# Patient Record
Sex: Male | Born: 1995
Health system: Southern US, Community
[De-identification: ages and names within clinical notes are randomized; demographics above are authoritative.]

## PROBLEM LIST (undated history)

## (undated) DIAGNOSIS — B20 Human immunodeficiency virus [HIV] disease: Secondary | ICD-10-CM

## (undated) HISTORY — DX: Human immunodeficiency virus (HIV) disease: B20

---

## 2016-01-02 ENCOUNTER — Emergency Department (HOSPITAL_COMMUNITY)
Admission: EM | Admit: 2016-01-02 | Discharge: 2016-01-02 | Disposition: A | Discharge status: Home / Self Care | Payer: Medicaid Other | Attending: Emergency Medicine | Admitting: Emergency Medicine

## 2016-01-02 ENCOUNTER — Encounter (HOSPITAL_COMMUNITY): Payer: Self-pay | Admitting: Emergency Medicine

## 2016-01-02 DIAGNOSIS — F17210 Nicotine dependence, cigarettes, uncomplicated: Secondary | ICD-10-CM | POA: Diagnosis not present

## 2016-01-02 DIAGNOSIS — J029 Acute pharyngitis, unspecified: Secondary | ICD-10-CM | POA: Diagnosis not present

## 2016-01-02 LAB — RAPID STREP SCREEN (MED CTR MEBANE ONLY): STREPTOCOCCUS, GROUP A SCREEN (DIRECT): NEGATIVE

## 2016-01-02 MED ORDER — HYDROCODONE-ACETAMINOPHEN 7.5-325 MG/15ML PO SOLN: 15.0000 mL | Freq: Three times a day (TID) | ORAL | Status: DC | PRN

## 2016-01-02 MED ORDER — ACETAMINOPHEN 500 MG PO TABS
1000.0000 mg | ORAL_TABLET | Freq: Once | ORAL | Status: AC
  Administered 2016-01-02: 1000 mg via ORAL
  Filled 2016-01-02: qty 2

## 2016-01-02 NOTE — Discharge Instructions (Signed)
Take your medication as prescribed as needed for sore throat. I also recommend taking 600 mg ibuprofen 4 times daily as needed for pain relief. I recommend drinking at least six 8 ounce glasses of water daily to remain hydrated at home. Please follow up with a primary care provider from the Resource Guide provided below in 4-5 days if your symptoms have not improved. Please return to the Emergency Department if symptoms worsen or new onset of fever, facial/neck swelling, difficulty swallowing, unable to swallow due to pain/swelling resulting in drooling, unable to open jaw, difficulty breathing, change in voice. Pharyngitis Pharyngitis is redness, pain, and swelling (inflammation) of your pharynx.  CAUSES  Pharyngitis is usually caused by infection. Most of the time, these infections are from viruses (viral) and are part of a cold. However, sometimes pharyngitis is caused by bacteria (bacterial). Pharyngitis can also be caused by allergies. Viral pharyngitis may be spread from person to person by coughing, sneezing, and personal items or utensils (cups, forks, spoons, toothbrushes). Bacterial pharyngitis may be spread from person to person by more intimate contact, such as kissing.  SIGNS AND SYMPTOMS  Symptoms of pharyngitis include:   Sore throat.   Tiredness (fatigue).   Low-grade fever.   Headache.  Joint pain and muscle aches.  Skin rashes.  Swollen lymph nodes.  Plaque-like film on throat or tonsils (often seen with bacterial pharyngitis). DIAGNOSIS  Your health care provider will ask you questions about your illness and your symptoms. Your medical history, along with a physical exam, is often all that is needed to diagnose pharyngitis. Sometimes, a rapid strep test is done. Other lab tests may also be done, depending on the suspected cause.  TREATMENT  Viral pharyngitis will usually get better in 3-4 days without the use of medicine. Bacterial pharyngitis is treated with  medicines that kill germs (antibiotics).  HOME CARE INSTRUCTIONS   Drink enough water and fluids to keep your urine clear or pale yellow.   Only take over-the-counter or prescription medicines as directed by your health care provider:   If you are prescribed antibiotics, make sure you finish them even if you start to feel better.   Do not take aspirin.   Get lots of rest.   Gargle with 8 oz of salt water ( tsp of salt per 1 qt of water) as often as every 1-2 hours to soothe your throat.   Throat lozenges (if you are not at risk for choking) or sprays may be used to soothe your throat. SEEK MEDICAL CARE IF:   You have large, tender lumps in your neck.  You have a rash.  You cough up green, yellow-brown, or bloody spit. SEEK IMMEDIATE MEDICAL CARE IF:   Your neck becomes stiff.  You drool or are unable to swallow liquids.  You vomit or are unable to keep medicines or liquids down.  You have severe pain that does not go away with the use of recommended medicines.  You have trouble breathing (not caused by a stuffy nose). MAKE SURE YOU:   Understand these instructions.  Will watch your condition.  Will get help right away if you are not doing well or get worse.   This information is not intended to replace advice given to you by your health care provider. Make sure you discuss any questions you have with your health care provider.   Document Released: 08/26/2005 Document Revised: 06/16/2013 Document Reviewed: 05/03/2013 Elsevier Interactive Patient Education [employer]2016 Elsevier Inc.

## 2016-01-02 NOTE — ED Provider Notes (Signed)
CSN: [id number]     Arrival date & time 01/02/16  1722 History  By signing my name below, I, Iona Beard, attest that this documentation has been prepared under the direction and in the presence of Melburn Hake, PA-C  Electronically Signed: Iona Beard, ED Scribe 01/02/2016 at 6:20 PM.   Chief Complaint  Patient presents with  . Sore Throat    The history is provided by the patient. No language interpreter was used.   HPI Comments: Aaron Phelps is a 20 y.o. male who presents to the Emergency Department complaining of gradual onset, constant, sore throat, ongoing for two days. Pt reports associated productive cough with clear mucous ongoing for one week. No other associated symptoms noted. Throat pain is worsened with swallowing. Pt has not taken any medication for the pain PTA. No other worsening or alleviating factors noted. Pt denies fever, ear pain, facial swelling, neck swelling, drooling, voice changes, nausea, vomiting, wheezing, shortness of breath, or any other pertinent symptoms.   History reviewed. No pertinent past medical history. History reviewed. No pertinent past surgical history. History reviewed. No pertinent family history. Social History  Substance Use Topics  . Smoking status: Current Every Day Smoker    Types: Cigarettes  . Smokeless tobacco: None  . Alcohol Use: Yes    Review of Systems  Constitutional: Negative for fever.  HENT: Positive for sore throat. Negative for drooling, ear pain, facial swelling and voice change.   Respiratory: Positive for cough. Negative for shortness of breath and wheezing.   Gastrointestinal: Negative for nausea and vomiting.    Allergies  Review of patient's allergies indicates no known allergies.  Home Medications   Prior to Admission medications   Medication Sig Start Date End Date Taking? Authorizing Provider  HYDROcodone-acetaminophen (HYCET) 7.5-325 mg/15 ml solution Take 15 mLs by mouth every 8 (eight) hours  as needed for moderate pain. 01/02/16   Satira Sark Nadeau, PA-C   BP 121/80 mmHg  Pulse 73  Temp(Src) 99.9 F (37.7 C) (Oral)  Resp 12  Ht  (1.778 m)  Wt 125 lb (56.7 kg)  BMI 17.94 kg/m2  SpO2 100% Physical Exam  Constitutional: He is oriented to person, place, and time. He appears well-developed and well-nourished.  HENT:  Head: Normocephalic and atraumatic.  Right Ear: Tympanic membrane normal.  Left Ear: Tympanic membrane normal.  Nose: Nose normal. Right sinus exhibits no maxillary sinus tenderness and no frontal sinus tenderness. Left sinus exhibits no maxillary sinus tenderness and no frontal sinus tenderness.  Mouth/Throat: Uvula is midline and mucous membranes are normal. No trismus in the jaw. Normal dentition. No dental abscesses, uvula swelling or dental caries. Posterior oropharyngeal edema and posterior oropharyngeal erythema present. No oropharyngeal exudate or tonsillar abscesses.  No facial or neck swelling. No muffled voice.  Eyes: Conjunctivae and EOM are normal. Pupils are equal, round, and reactive to light. Right eye exhibits no discharge. Left eye exhibits no discharge. No scleral icterus.  Neck: Normal range of motion. Neck supple.  Cardiovascular: Normal rate, regular rhythm, normal heart sounds and intact distal pulses.   Pulmonary/Chest: Effort normal and breath sounds normal. No stridor. No respiratory distress. He has no wheezes. He has no rales. He exhibits no tenderness.  Abdominal: Soft. He exhibits no distension.  Lymphadenopathy:    He has cervical adenopathy (bilateral submandibular ).  Neurological: He is alert and oriented to person, place, and time.  Skin: Skin is warm and dry.  Nursing note and vitals reviewed.  ED Course  Procedures (including critical care time) DIAGNOSTIC STUDIES: Oxygen Saturation is 100% on RA, normal by my interpretation.    COORDINATION OF CARE: 5:43 PM-Discussed treatment plan which includes rapid strep  screen and tylenol with pt at bedside and pt agreed to plan.   Labs Review Labs Reviewed  [facility])  CULTURE, GROUP A STREP [facility])    Imaging Review No results found. I have personally reviewed and evaluated these lab results as part of my medical decision-making.   EKG Interpretation None      MDM   Final diagnoses:  Pharyngitis   Pt afebrile without tonsillar exudate, negative strep. Presents with mild cervical lymphadenopathy, & dysphagia; diagnosis of viral pharyngitis. No abx indicated. DC w symptomatic tx for pain  Pt does not appear dehydrated, but did discuss importance of water rehydration. Presentation non concerning for PTA or infxn spread to soft tissue. No trismus or uvula deviation. Specific return precautions discussed. Pt able to drink water in ED without difficulty with intact air way. Recommended PCP follow up.   I personally performed the services described in this documentation, which was scribed in my presence. The recorded information has been reviewed and is accurate.    Satira Sarkicole Elizabeth HinesNadeau, [place] JerseyPA-C 01/02/16 1821  Pricilla LovelessScott Goldston, MD 01/04/16 [phone]

## 2016-01-02 NOTE — ED Notes (Signed)
Pt states he has had a sore throat for several days. Pt states it is painful to swallow. Pt denies any other symptoms

## 2016-01-04 LAB — CULTURE, GROUP A STREP (THRC)

## 2016-01-06 ENCOUNTER — Encounter (HOSPITAL_COMMUNITY): Payer: Self-pay

## 2016-01-06 ENCOUNTER — Emergency Department (HOSPITAL_COMMUNITY)
Admission: EM | Admit: 2016-01-06 | Discharge: 2016-01-06 | Disposition: A | Discharge status: Home / Self Care | Payer: Medicaid Other | Attending: Emergency Medicine | Admitting: Emergency Medicine

## 2016-01-06 DIAGNOSIS — F17210 Nicotine dependence, cigarettes, uncomplicated: Secondary | ICD-10-CM | POA: Diagnosis not present

## 2016-01-06 DIAGNOSIS — J020 Streptococcal pharyngitis: Secondary | ICD-10-CM | POA: Diagnosis not present

## 2016-01-06 DIAGNOSIS — J029 Acute pharyngitis, unspecified: Secondary | ICD-10-CM | POA: Diagnosis present

## 2016-01-06 MED ORDER — PENICILLIN G BENZATHINE 1200000 UNIT/2ML IM SUSP
1.2000 10*6.[IU] | Freq: Once | INTRAMUSCULAR | Status: AC
  Administered 2016-01-06: 1.2 10*6.[IU] via INTRAMUSCULAR
  Filled 2016-01-06: qty 2

## 2016-01-06 MED ORDER — DEXAMETHASONE SODIUM PHOSPHATE 10 MG/ML IJ SOLN
10.0000 mg | Freq: Once | INTRAMUSCULAR | Status: AC
  Administered 2016-01-06: 10 mg via INTRAMUSCULAR
  Filled 2016-01-06: qty 1

## 2016-01-06 NOTE — ED Notes (Addendum)
Patient here with ongoing swollen tonsils since visit on the 25th. Patient thinks something in the tonsils and request recheck. Negative strep on previous visit, left tonsil swollen on assessment

## 2016-01-06 NOTE — Discharge Instructions (Signed)
Strep Throat °Strep throat is a bacterial infection of the throat. Your health care provider may call the infection tonsillitis or pharyngitis, depending on whether there is swelling in the tonsils or at the back of the throat. Strep throat is most common during the cold months of the year in children who are 5-21 years of age, but it can happen during any season in people of any age. This infection is spread from person to person (contagious) through coughing, sneezing, or close contact. °CAUSES °Strep throat is caused by the bacteria called Streptococcus pyogenes. °RISK FACTORS °This condition is more likely to develop in: °· People who spend time in crowded places where the infection can spread easily. °· People who have close contact with someone who has strep throat. °SYMPTOMS °Symptoms of this condition include: °· Fever or chills.   °· Redness, swelling, or pain in the tonsils or throat. °· Pain or difficulty when swallowing. °· White or yellow spots on the tonsils or throat. °· Swollen, tender glands in the neck or under the jaw. °· Red rash all over the body (rare). °DIAGNOSIS °This condition is diagnosed by performing a rapid strep test or by taking a swab of your throat (throat culture test). Results from a rapid strep test are usually ready in a few minutes, but throat culture test results are available after one or two days. °TREATMENT °This condition is treated with antibiotic medicine. °HOME CARE INSTRUCTIONS °Medicines °· Take over-the-counter and prescription medicines only as told by your health care provider. °· Take your antibiotic as told by your health care provider. Do not stop taking the antibiotic even if you start to feel better. °· Have family members who also have a sore throat or fever tested for strep throat. They may need antibiotics if they have the strep infection. °Eating and Drinking °· Do not share food, drinking cups, or personal items that could cause the infection to spread to  other people. °· If swallowing is difficult, try eating soft foods until your sore throat feels better. °· Drink enough fluid to keep your urine clear or pale yellow. °General Instructions °· Gargle with a salt-water mixture 3-4 times per day or as needed. To make a salt-water mixture, completely dissolve ½-1 tsp of salt in 1 cup of warm water. °· Make sure that all household members wash their hands well. °· Get plenty of rest. °· Stay home from school or work until you have been taking antibiotics for 24 hours. °· Keep all follow-up visits as told by your health care provider. This is important. °SEEK MEDICAL CARE IF: °· The glands in your neck continue to get bigger. °· You develop a rash, cough, or earache. °· You cough up a thick liquid that is green, yellow-brown, or bloody. °· You have pain or discomfort that does not get better with medicine. °· Your problems seem to be getting worse rather than better. °· You have a fever. °SEEK IMMEDIATE MEDICAL CARE IF: °· You have new symptoms, such as vomiting, severe headache, stiff or painful neck, chest pain, or shortness of breath. °· You have severe throat pain, drooling, or changes in your voice. °· You have swelling of the neck, or the skin on the neck becomes red and tender. °· You have signs of dehydration, such as fatigue, dry mouth, and decreased urination. °· You become increasingly sleepy, or you cannot wake up completely. °· Your joints become red or painful. °  °This information is not intended to replace   advice given to you by your health care provider. Make sure you discuss any questions you have with your health care provider.    Continue taking home Tylenol and pain medication as needed. Follow up with ENT provider if her symptoms are improved. Please return to the emergency department if you experience severe worsening of your symptoms, rash, increased fever, difficulty breathing or swallowing.

## 2016-01-06 NOTE — ED Provider Notes (Signed)
CSN: [id number]     Arrival date & time 01/06/16  1306 History   First MD Initiated Contact with Patient 01/06/16 1423     Chief Complaint  Patient presents with  . swollen tonsils      (Consider location/radiation/quality/duration/timing/severity/associated sxs/prior Treatment) HPI   Coltyn Hanning is a 20 year old male with no significant past medical history who presents the ED today complaining of sore throat. Patient states that he was in the ED 4 days ago with milder symptoms and had a negative strep test. He has been taking hydrocodone which relieved his pain but he feels that the infection is getting worse. He looked in the back of his throat yesterday and noticed that his left tonsil was really swollen and it looks like there is white stuff on it. Patient is concerned that it is infected. He states he is able to swallow but it is painful. Patient reports subjective fevers which has been taking Tylenol for with minimal relief. He denies cough, rhinorrhea, otalgia, drooling, difficulty breathing.   History reviewed. No pertinent past medical history. History reviewed. No pertinent past surgical history. No family history on file. Social History  Substance Use Topics  . Smoking status: Current Every Day Smoker    Types: Cigarettes  . Smokeless tobacco: None  . Alcohol Use: Yes    Review of Systems  All other systems reviewed and are negative.     Allergies  Review of patient's allergies indicates no known allergies.  Home Medications   Prior to Admission medications   Medication Sig Start Date End Date Taking? Authorizing Provider  HYDROcodone-acetaminophen (HYCET) 7.5-325 mg/15 ml solution Take 15 mLs by mouth every 8 (eight) hours as needed for moderate pain. 01/02/16   Satira Sark Nadeau, PA-C   BP 121/86 mmHg  Pulse 117  Temp(Src) 98.1 F (36.7 C) (Oral)  Resp 18  Ht  (1.778 m)  Wt 56.7 kg  BMI 17.94 kg/m2  SpO2 98% Physical Exam  Constitutional:  He is oriented to person, place, and time. He appears well-developed and well-nourished. No distress.  HENT:  Head: Normocephalic and atraumatic.  Mouth/Throat: Uvula is midline. No trismus in the jaw. No uvula swelling. Oropharyngeal exudate, posterior oropharyngeal edema and posterior oropharyngeal erythema present.  Bilateral tonsillar exudate present. Left greater than right. No PTA. Uvula midline.  Eyes: Conjunctivae are normal. Right eye exhibits no discharge. Left eye exhibits no discharge. No scleral icterus.  Cardiovascular: Normal rate.   Pulmonary/Chest: Effort normal.  Neurological: He is alert and oriented to person, place, and time. Coordination normal.  Skin: Skin is warm and dry. No rash noted. He is not diaphoretic. No erythema. No pallor.  Psychiatric: He has a normal mood and affect. His behavior is normal.  Nursing note and vitals reviewed.   ED Course  Procedures (including critical care time) Labs Review Labs Reviewed - No data to display  Imaging Review No results found. I have personally reviewed and evaluated these images and lab results as part of my medical decision-making.   EKG Interpretation None      MDM   Final diagnoses:  Strep pharyngitis    Pt  With significant tonsillar exudate, cervical lymphadenopathy, & dysphagia; diagnosis of strep. Treated in the Ed with steroids, NSAIDs, Pain medication and PCN IM.  Pt appears mildly dehydrated, discussed importance of water rehydration. Presentation non concerning for PTA or infxn spread to soft tissue. No trismus or uvula deviation. Specific return precautions discussed. Pt able to drink  water in ED without difficulty with intact air way. Recommended PCP follow up. ENT referral given per pt request.     Dub MikesSamantha Tripp Jaamal Farooqui, PA-C 01/06/16 1531  Linwood DibblesJon Knapp, MD 01/06/16 2044

## 2016-02-03 ENCOUNTER — Emergency Department (HOSPITAL_COMMUNITY)
Admission: EM | Admit: 2016-02-03 | Discharge: 2016-02-03 | Disposition: A | Discharge status: Home / Self Care | Payer: Medicaid Other | Attending: Emergency Medicine | Admitting: Emergency Medicine

## 2016-02-03 ENCOUNTER — Encounter (HOSPITAL_COMMUNITY): Payer: Self-pay

## 2016-02-03 DIAGNOSIS — L219 Seborrheic dermatitis, unspecified: Secondary | ICD-10-CM

## 2016-02-03 DIAGNOSIS — F17210 Nicotine dependence, cigarettes, uncomplicated: Secondary | ICD-10-CM | POA: Insufficient documentation

## 2016-02-03 DIAGNOSIS — K6289 Other specified diseases of anus and rectum: Secondary | ICD-10-CM | POA: Diagnosis present

## 2016-02-03 DIAGNOSIS — Z79899 Other long term (current) drug therapy: Secondary | ICD-10-CM | POA: Insufficient documentation

## 2016-02-03 DIAGNOSIS — K602 Anal fissure, unspecified: Secondary | ICD-10-CM | POA: Diagnosis not present

## 2016-02-03 MED ORDER — KETOCONAZOLE 2 % EX CREA: 1.0000 "application " | TOPICAL_CREAM | Freq: Every day | CUTANEOUS | Status: DC

## 2016-02-03 MED ORDER — NITROGLYCERIN 2 % TD OINT: 0.5000 [in_us] | TOPICAL_OINTMENT | Freq: Three times a day (TID) | TRANSDERMAL | Status: DC

## 2016-02-03 MED ORDER — LIDOCAINE 2 % EX GEL: 0.5000 [in_us] | CUTANEOUS | Status: DC | PRN

## 2016-02-03 MED ORDER — DOCUSATE SODIUM 100 MG PO CAPS: 100.0000 mg | ORAL_CAPSULE | Freq: Two times a day (BID) | ORAL | Status: DC

## 2016-02-03 NOTE — Discharge Instructions (Signed)
Anal Fissure, Adult An anal fissure is a small tear or crack in the skin around the anus. Bleeding from a fissure usually stops on its own within a few minutes. However, bleeding will often occur again with each bowel movement until the crack heals. CAUSES This condition may be caused by:  Passing large, hard stool (feces).  Frequent diarrhea.  Constipation.  Inflammatory bowel disease (Crohn disease or ulcerative colitis).  Infections.  Anal sex. SYMPTOMS Symptoms of this condition include:  Bleeding from the rectum.  Small amounts of blood seen on your stool, on toilet paper, or in the toilet after a bowel movement.  Painful bowel movements.  Itching or irritation around the anus. DIAGNOSIS A health care provider may diagnose this condition by closely examining the anal area. An anal fissure can usually be seen with careful inspection. In some cases, a rectal exam may be performed, or a short tube (anoscope) may be used to examine the anal canal. TREATMENT Treatment for this condition may include:  Taking steps to avoid constipation. This may include making changes to your diet, such as increasing your intake of fiber or fluid.  Taking fiber supplements. These supplements can soften your stool to help make bowel movements easier. Your health care provider may also prescribe a stool softener if your stool is often hard.  Taking sitz baths. This may help to heal the tear.  Using medicated creams or ointments. These may be prescribed to lessen discomfort. HOME CARE INSTRUCTIONS Eating and Drinking  Avoid foods that may be constipating, such as bananas and dairy products.  Drink enough fluid to keep your urine clear or pale yellow.  Maintain a diet that is high in fruits, whole grains, and vegetables. General Instructions  Keep the anal area as clean and dry as possible.  Take sitz baths as told by your health care provider. Do not use soap in the sitz baths.  Take  over-the-counter and prescription medicines only as told by your health care provider.  Use creams or ointments only as told by your health care provider.  Keep all follow-up visits as told by your health care provider. This is important. SEEK MEDICAL CARE IF:  You have more bleeding.  You have a fever.  You have diarrhea that is mixed with blood.  You continue to have pain.  Your problem is getting worse rather than better.   This information is not intended to replace advice given to you by your health care provider. Make sure you discuss any questions you have with your health care provider.   Document Released: 08/26/2005 Document Revised: 05/17/2015 Document Reviewed: 11/21/2014 Elsevier Interactive Patient Education 2016 Elsevier Inc.  Seborrheic Dermatitis Seborrheic dermatitis involves pink or red skin with greasy, flaky scales. It usually occurs on the scalp, and it is often called dandruff. This condition may also affect the eyebrows, nose, ears, chest, and the bearded area of men's faces. It often occurs where skin has more oil (sebaceous) glands. It may come and go for no known reason, and it is often long-lasting (chronic). CAUSES The cause is not known. RISK FACTORS This condition is more like to develop in:  People who are stressed or tired.  People who have skin conditions, such as acne.  People who have certain conditions, such as:  HIV (human immunodeficiency virus).  AIDS (acquired immunodeficiency syndrome).  Parkinson disease.  An eating disorder.  Stroke.  Depression.  Epilepsy.  Alcoholism.  People who live in places that have extreme  weather.  People who have a family history of seborrheic dermatitis.  People who use skin creams that are made with alcohol.  People who are 51-82 years old.  People who take certain medicines. SYMPTOMS Symptoms of this condition include:  Thick scales on the scalp.  Redness on the face or in the  armpits.  Skin that is flaky. The flakes may be white or yellow.  Skin that seems oily or dry but is not helped with moisturizers.  Itching or burning in the affected areas. DIAGNOSIS This condition is diagnosed with a medical history and physical exam. A sample of your skin may be tested (skin biopsy). You may need to see a skin specialist (dermatologist). TREATMENT There is no cure for this condition, but treatment can help to manage the symptoms. Treatment may include:  Cortisone (steroid) ointments, creams, and lotions.  Over-the-counter or prescription shampoos. HOME CARE INSTRUCTIONS  Apply over-the-counter and prescription medicines only as told by your health care provider.  Keep all follow-up visits as told by your health care provider. This is important.  Try to reduce your stress, such as with yoga or mediation. If you need help to reduce stress, ask your health care provider.  Shower or bathe as told by your health care provider.  Use any medicated shampoos as told by your health care provider. SEEK MEDICAL CARE IF:  Your symptoms do not improve with treatment.  Your symptoms get worse.  You have new symptoms.   This information is not intended to replace advice given to you by your health care provider. Make sure you discuss any questions you have with your health care provider.  Take sitz baths daily. This will help loosen the anal mucosa and allow it to heal. Applied nitroglycerin ointment 3 times daily to the rectum. Apply lidocaine jelly as needed for pain and numbing. Take stool softeners daily to allow for easy or bowel movements. Apply ketoconazole cream to your face twice daily. Also recommend using head and shoulders shampoo for dandruff. Return to the ED if you experience severe worsening of your symptoms, blood in your bowel movements.

## 2016-02-03 NOTE — ED Notes (Signed)
Patient here complaining of rectal pain with sitting from hemorrhoid and dry skin to face.

## 2016-02-03 NOTE — ED Provider Notes (Signed)
CSN: [id number]     Arrival date & time 02/03/16  1242 History   First MD Initiated Contact with Patient 02/03/16 1339     Chief Complaint  Patient presents with  . Hemorrhoids     (Consider location/radiation/quality/duration/timing/severity/associated sxs/prior Treatment) HPI   Aaron Phelps is a 20 year old male with no significant past medical history presents the ED today with multiple complaints. Patient states that 3 days ago he developed acute onset severe rectal pain that occurred after anal intercourse. Patient states that he has significant pain with bowel movements and pain with sitting down. Patient has not tried taking anything for his symptoms. Pain is mildly relieved when he lays on his side. He denies any melena or hematochezia. Patient thinks that he may have hemorrhoids but he doesn't know because he has never had them before.  Patient also complaining of rash to his face. Patient states that initially started out as dry skin around his nose but is since spread to his cheeks and forehead. He has been placing Vaseline and cocoa butter on it without relief rashes occasionally itchy.  History reviewed. No pertinent past medical history. History reviewed. No pertinent past surgical history. No family history on file. Social History  Substance Use Topics  . Smoking status: Current Every Day Smoker    Types: Cigarettes  . Smokeless tobacco: None  . Alcohol Use: Yes    Review of Systems  All other systems reviewed and are negative.     Allergies  Review of patient's allergies indicates no known allergies.  Home Medications   Prior to Admission medications   Medication Sig Start Date End Date Taking? Authorizing Provider  docusate sodium (COLACE) 100 MG capsule Take 1 capsule (100 mg total) by mouth every 12 (twelve) hours. 02/03/16   Samantha Tripp Dowless, PA-C  HYDROcodone-acetaminophen (HYCET) 7.5-325 mg/15 ml solution Take 15 mLs by mouth every 8 (eight) hours  as needed for moderate pain. 01/02/16   Barrett Henle, PA-C  ketoconazole (NIZORAL) 2 % cream Apply 1 application topically daily. 02/03/16   Samantha Tripp Dowless, PA-C  Lidocaine 2 % GEL Apply 0.5 inches topically as needed. 02/03/16   Samantha Tripp Dowless, PA-C  nitroGLYCERIN (NITROGLYN) 2 % ointment Apply 0.5 inches topically 3 (three) times daily. 02/03/16   Samantha Tripp Dowless, PA-C   BP 122/83 mmHg  Pulse 71  Temp(Src) 98.6 F (37 C) (Oral)  Resp 16  SpO2 100% Physical Exam  Constitutional: He is oriented to person, place, and time. He appears well-developed and well-nourished. No distress.  HENT:  Head: Normocephalic and atraumatic.  Eyes: Conjunctivae are normal. Right eye exhibits no discharge. Left eye exhibits no discharge. No scleral icterus.  Cardiovascular: Normal rate.   Pulmonary/Chest: Effort normal.  Genitourinary:  Small anal fissure noted on rectum. Pain felt with DRE. No gross blood on rectal exam. No abscess. No external hemorrhoids.   Neurological: He is alert and oriented to person, place, and time. Coordination normal.  Skin: Skin is warm and dry. Rash noted. He is not diaphoretic. No erythema. No pallor.  Dry, flaky rash to bridge of nose, cheeks and forehead.  Psychiatric: He has a normal mood and affect. His behavior is normal.  Nursing note and vitals reviewed.   ED Course  Procedures (including critical care time) Labs Review Labs Reviewed - No data to display  Imaging Review No results found. I have personally reviewed and evaluated these images and lab results as part of my medical decision-making.  EKG Interpretation None      MDM   Final diagnoses:  Anal fissure  Seborrheic dermatitis    Patient presents with small anal fissure noted on rectum likely caused by anal penetration. No gross abscess or deep tear. No blood on rectal exam. We'll treat conservatively with sitz baths, topical nitroglycerin and lidocaine jelly for  pain relief. We'll also give stool softeners to ease bowel movements. Patient also presents with rash on his face consistent with seborrheic dermatitis. We'll treat with ketoconazole cream. Also recommend head and shoulders shampoo. Follow-up with PCP as needed.    Lester KinsmanSamantha Tripp DumasDowless, PA-C 02/03/16 1432  Glynn OctaveStephen Rancour, MD 02/03/16 [phone]

## 2016-02-03 NOTE — ED Notes (Signed)
Samantha PA at bedside 

## 2016-05-25 ENCOUNTER — Emergency Department (HOSPITAL_COMMUNITY)
Admission: EM | Admit: 2016-05-25 | Discharge: 2016-05-25 | Disposition: A | Discharge status: Home / Self Care | Payer: Medicaid Other | Attending: Emergency Medicine | Admitting: Emergency Medicine

## 2016-05-25 ENCOUNTER — Encounter (HOSPITAL_COMMUNITY): Payer: Self-pay | Admitting: Emergency Medicine

## 2016-05-25 DIAGNOSIS — R21 Rash and other nonspecific skin eruption: Secondary | ICD-10-CM | POA: Insufficient documentation

## 2016-05-25 DIAGNOSIS — F17210 Nicotine dependence, cigarettes, uncomplicated: Secondary | ICD-10-CM | POA: Diagnosis not present

## 2016-05-25 DIAGNOSIS — J029 Acute pharyngitis, unspecified: Secondary | ICD-10-CM | POA: Insufficient documentation

## 2016-05-25 LAB — RAPID STREP SCREEN (MED CTR MEBANE ONLY): STREPTOCOCCUS, GROUP A SCREEN (DIRECT): NEGATIVE

## 2016-05-25 MED ORDER — ACETAMINOPHEN 500 MG PO TABS
1000.0000 mg | ORAL_TABLET | Freq: Once | ORAL | Status: AC
  Administered 2016-05-25: 1000 mg via ORAL
  Filled 2016-05-25: qty 2

## 2016-05-25 MED ORDER — IBUPROFEN 800 MG PO TABS: 800.0000 mg | ORAL_TABLET | Freq: Three times a day (TID) | ORAL | 0 refills | Status: DC | PRN

## 2016-05-25 NOTE — ED Provider Notes (Signed)
[facility]-EMERGENCY DEPT Provider Note   CSN: [id number] Arrival date & time: 05/25/16  1424 By signing my name below, I, Bridgette HabermannMaria Tan, attest that this documentation has been prepared under the direction and in the presence of Bashir Marchetti, PA-C. Electronically Signed: Bridgette HabermannMaria Tan, ED Scribe. 05/25/16. 3:02 PM.  History   Chief Complaint Chief Complaint  Patient presents with  . Sore Throat  . Rash   HPI Comments: Aaron Phelps is a 20 y.o. male who presents to the Emergency Department complaining of sore throat onset 2 days ago. Pt also has associated generalized body aches and fever. Pt states he is eating and drinking normally. Pt was seen here a couple months ago for sore throat and was treated with "a shot" and completely resolved. Pt states these symptoms are exactly the same. Pt denies vomiting, diarrhea, or any other associated symptoms.  Does not have regular HIV/STD testing but has been monogamous with one partner for >1 year, doubts STD exposure.   Pt is also complaining of pruritic rash onset one week ago. Pt states he slept over at a friend's house and they think they're bed bugs. Pt put cortisone ointment with mild relief.  States overall it is improving significantly.      The history is provided by the patient. No language interpreter was used.    History reviewed. No pertinent past medical history.  There are no active problems to display for this patient.   History reviewed. No pertinent surgical history.     Home Medications    Prior to Admission medications   Medication Sig Start Date End Date Taking? Authorizing Provider  docusate sodium (COLACE) 100 MG capsule Take 1 capsule (100 mg total) by mouth every 12 (twelve) hours. 02/03/16   Samantha Tripp Dowless, PA-C  HYDROcodone-acetaminophen (HYCET) 7.5-325 mg/15 ml solution Take 15 mLs by mouth every 8 (eight) hours as needed for moderate pain. 01/02/16   Barrett HenleNicole Elizabeth Nadeau, PA-C  ibuprofen (ADVIL,MOTRIN) 800 MG  tablet Take 1 tablet (800 mg total) by mouth every 8 (eight) hours as needed for mild pain or moderate pain. 05/25/16   Trixie DredgeEmily Ivianna Notch, PA-C  ketoconazole (NIZORAL) 2 % cream Apply 1 application topically daily. 02/03/16   Samantha Tripp Dowless, PA-C  Lidocaine 2 % GEL Apply 0.5 inches topically as needed. 02/03/16   Samantha Tripp Dowless, PA-C  nitroGLYCERIN (NITROGLYN) 2 % ointment Apply 0.5 inches topically 3 (three) times daily. 02/03/16   Samantha Tripp Dowless, PA-C    Family History No family history on file.  Social History Social History  Substance Use Topics  . Smoking status: Current Every Day Smoker    Types: Cigarettes  . Smokeless tobacco: Never Used  . Alcohol use Yes     Allergies   Review of patient's allergies indicates no known allergies.   Review of Systems Review of Systems  Constitutional: Positive for fever.  HENT: Positive for sore throat. Negative for trouble swallowing.   Respiratory: Negative for cough, shortness of breath, wheezing and stridor.   Gastrointestinal: Negative for abdominal pain, diarrhea and vomiting.  Musculoskeletal: Positive for back pain and myalgias.  Skin: Positive for rash.  Allergic/Immunologic: Negative for immunocompromised state.  Psychiatric/Behavioral: Negative for self-injury.     Physical Exam Updated Vital Signs BP 117/86 (BP Location: Right Arm)   Pulse 93   Temp 101 F (38.3 C) (Oral)   Resp 16   Ht 5\' 10"  (1.778 m)   Wt 51.3 kg   SpO2 100%  BMI 16.21 kg/m   Physical Exam  Constitutional: He appears well-developed and well-nourished. No distress.  HENT:  Head: Normocephalic and atraumatic.  Mouth/Throat: Uvula is midline. Mucous membranes are not dry. No trismus in the jaw. No uvula swelling. Oropharyngeal exudate and posterior oropharyngeal erythema present. No posterior oropharyngeal edema or tonsillar abscesses.  Eyes: Conjunctivae and EOM are normal. Right eye exhibits no discharge. Left eye exhibits no  discharge.  Neck: Normal range of motion. Neck supple.  Cardiovascular: Normal rate and regular rhythm.   Pulmonary/Chest: Effort normal and breath sounds normal. No stridor. No respiratory distress. He has no wheezes. He has no rales.  Lymphadenopathy:    He has no cervical adenopathy.  Neurological: He is alert.  Skin: He is not diaphoretic.  Nursing note and vitals reviewed.    ED Treatments / Results  DIAGNOSTIC STUDIES: Oxygen Saturation is 100% on RA, normal by my interpretation.    COORDINATION OF CARE: 3:03 PM Discussed treatment plan with pt at bedside and pt agreed to plan.  Labs (all labs ordered are listed, but only abnormal results are displayed) Labs Reviewed  RAPID STREP SCREEN (NOT AT [facility])  CULTURE, GROUP A STREP [facility])    EKG  EKG Interpretation None       Radiology No results found.  Procedures Procedures (including critical care time)  Medications Ordered in ED Medications  acetaminophen (TYLENOL) tablet 1,000 mg (1,000 mg Oral Given 05/25/16 1458)     Initial Impression / Assessment and Plan / ED Course  I have reviewed the triage vital signs and the nursing notes.  Pertinent labs & imaging results that were available during my care of the patient were reviewed by me and considered in my medical decision making (see chart for details).  Clinical Course    Afebrile, nontoxic patient with sore throat.  Strep screen negative.  No airway concerns.  Doubt peritonsillar abscess.   D/C home with NSAIDs, PCP follow up.  Discussed result, findings, treatment, and follow up  with patient.  Pt given return precautions.  Pt verbalizes understanding and agrees with plan.       Final Clinical Impressions(s) / ED Diagnoses   Final diagnoses:  Pharyngitis    New Prescriptions New Prescriptions   IBUPROFEN (ADVIL,MOTRIN) 800 MG TABLET    Take 1 tablet (800 mg total) by mouth every 8 (eight) hours as needed for mild pain or moderate pain.    I  personally performed the services described in this documentation, which was scribed in my presence. The recorded information has been reviewed and is accurate.     Trixie Dredge, PA-C 05/25/16 1652    Pricilla Loveless, MD 05/27/16 [phone]

## 2016-05-25 NOTE — ED Notes (Signed)
Ice chips given to pt.  

## 2016-05-25 NOTE — ED Triage Notes (Signed)
Pt c/o sore throat and bodyaches x 2 days. Pt is febrile and has exudate on tonsils.  Itching rash x 1 week since sleeping over at a friend's house.

## 2016-05-25 NOTE — Discharge Instructions (Signed)
Read the information below.  Use the prescribed medication as directed.  Please discuss all new medications with your pharmacist.  You may return to the Emergency Department at any time for worsening condition or any new symptoms that concern you.   If you develop high fevers, difficulty swallowing or breathing, or you are unable to tolerate fluids by mouth, return to the ER immediately for a recheck.    °

## 2016-05-27 LAB — CULTURE, GROUP A STREP (THRC)

## 2016-06-05 ENCOUNTER — Encounter (HOSPITAL_COMMUNITY): Payer: Self-pay | Admitting: Emergency Medicine

## 2016-06-05 ENCOUNTER — Emergency Department (HOSPITAL_COMMUNITY)
Admission: EM | Admit: 2016-06-05 | Discharge: 2016-06-05 | Disposition: A | Discharge status: Home / Self Care | Payer: Medicaid Other | Attending: Emergency Medicine | Admitting: Emergency Medicine

## 2016-06-05 ENCOUNTER — Emergency Department (HOSPITAL_COMMUNITY): Payer: Medicaid Other

## 2016-06-05 DIAGNOSIS — R079 Chest pain, unspecified: Secondary | ICD-10-CM | POA: Diagnosis present

## 2016-06-05 DIAGNOSIS — F17210 Nicotine dependence, cigarettes, uncomplicated: Secondary | ICD-10-CM | POA: Insufficient documentation

## 2016-06-05 DIAGNOSIS — J029 Acute pharyngitis, unspecified: Secondary | ICD-10-CM | POA: Diagnosis not present

## 2016-06-05 LAB — BASIC METABOLIC PANEL
Anion gap: 8 (ref 5–15)
BUN: 6 mg/dL (ref 6–20)
CALCIUM: 9.1 mg/dL (ref 8.9–10.3)
CO2: 27 mmol/L (ref 22–32)
CREATININE: 0.81 mg/dL (ref 0.61–1.24)
Chloride: 103 mmol/L (ref 101–111)
GFR calc non Af Amer: >60 mL/min (ref >60)
GLUCOSE: 124 mg/dL — AB (ref 65–99)
Potassium: 3.6 mmol/L (ref 3.5–5.1)
Sodium: 138 mmol/L (ref 135–145)

## 2016-06-05 LAB — CBC
HCT: 42.8 % (ref 39.0–52.0)
Hemoglobin: 14.6 g/dL (ref 13.0–17.0)
MCH: 31.3 pg (ref 26.0–34.0)
MCHC: 34.1 g/dL (ref 30.0–36.0)
MCV: 91.6 fL (ref 78.0–100.0)
PLATELETS: 237 10*3/uL (ref 150–400)
RBC: 4.67 MIL/uL (ref 4.22–5.81)
RDW: 12.8 % (ref 11.5–15.5)
WBC: 5.6 10*3/uL (ref 4.0–10.5)

## 2016-06-05 LAB — I-STAT TROPONIN, ED: TROPONIN I, POC: 0 ng/mL (ref 0.00–0.08)

## 2016-06-05 NOTE — ED Triage Notes (Signed)
Pt states he has white spots on his tonsils. Pt states he was treated with antibiotics 2 weeks ago and it did not help his throat. Denies throat pain or difficulty swallowing. Pt states he is also having CP. Pt woke up with CP in the night and felt SOB.

## 2016-06-05 NOTE — ED Provider Notes (Signed)
[facility] Provider Note   CSN: [id number] Arrival date & time: 06/05/16  1810     History   Chief Complaint Chief Complaint  Patient presents with  . Chest Pain  . Sore Throat    HPI Aaron Phelps is a 20 y.o. male.  Patient is here with recurrent sore throat without fever or difficulty swallowing. He has been seen for this multiple times in the emergency department with negative strep tests on record. He reports this time he is also experiencing chest pain that is sharp, sudden in onset, and short duration of 20 minutes. He has SOB with these episodes, also going on for the past week. No current chest pain. No reported drug use, specifically, no cocaine use.    The history is provided by the patient. No language interpreter was used.  Chest Pain   Associated symptoms include shortness of breath. Pertinent negatives include no fever, no headaches, no nausea and no vomiting.  Sore Throat  Associated symptoms include chest pain and shortness of breath. Pertinent negatives include no headaches.    History reviewed. No pertinent past medical history.  There are no active problems to display for this patient.   History reviewed. No pertinent surgical history.     Home Medications    Prior to Admission medications   Not on File    Family History History reviewed. No pertinent family history.  Social History Social History  Substance Use Topics  . Smoking status: Current Every Day Smoker    Types: Cigarettes  . Smokeless tobacco: Never Used  . Alcohol use Yes     Comment: occassion     Allergies   Review of patient's allergies indicates no known allergies.   Review of Systems Review of Systems  Constitutional: Negative for chills and fever.  HENT: Positive for sore throat. Negative for trouble swallowing.   Respiratory: Positive for shortness of breath.   Cardiovascular: Positive for chest pain.  Gastrointestinal: Negative.  Negative for nausea  and vomiting.  Musculoskeletal: Negative.  Negative for myalgias.  Skin: Negative.  Negative for rash.  Neurological: Negative.  Negative for headaches.     Physical Exam Updated Vital Signs BP 110/73   Pulse 74   Temp 99.1 F (37.3 C) (Oral)   Resp 14   Ht 5\' 10"  (1.778 m)   Wt 51.3 kg   SpO2 98%   BMI 16.21 kg/m   Physical Exam  Constitutional: He is oriented to person, place, and time. He appears well-developed and well-nourished.  HENT:  Head: Normocephalic.  Mouth/Throat: Oropharynx is clear and moist.  No exudates, no tonsillar swelling or uvular shift. No visualized peritonsillar abscess. Good dentition.   Neck: Normal range of motion. Neck supple.  Cardiovascular: Normal rate and regular rhythm.   No murmur heard. Pulmonary/Chest: Effort normal and breath sounds normal. He has no wheezes. He has no rales.  Abdominal: Soft. Bowel sounds are normal. There is no tenderness. There is no rebound and no guarding.  Musculoskeletal: Normal range of motion.  Neurological: He is alert and oriented to person, place, and time.  Skin: Skin is warm and dry. No rash noted.  Psychiatric: He has a normal mood and affect.     ED Treatments / Results  Labs (all labs ordered are listed, but only abnormal results are displayed) Labs Reviewed  BASIC METABOLIC PANEL - Abnormal; Notable for the following:       Result Value   Glucose, Bld 124 (*)  All other components within normal limits  RAPID STREP SCREEN (NOT AT [facility])  CBC  I-STAT TROPOININ, ED   Results for orders placed or performed during the hospital encounter of 06/05/16  Basic metabolic panel  Result Value Ref Range   Sodium 138 135 - 145 mmol/L   Potassium 3.6 3.5 - 5.1 mmol/L   Chloride 103 101 - 111 mmol/L   CO2 27 22 - 32 mmol/L   Glucose, Bld 124 (H) 65 - 99 mg/dL   BUN 6 6 - 20 mg/dL   Creatinine, Ser 2.950.81 0.61 - 1.24 mg/dL   Calcium 9.1 8.9 - 62.110.3 mg/dL   GFR calc non Af Amer >60 >60 mL/min   GFR calc  Af Amer >60 >60 mL/min   Anion gap 8 5 - 15  CBC  Result Value Ref Range   WBC 5.6 4.0 - 10.5 K/uL   RBC 4.67 4.22 - 5.81 MIL/uL   Hemoglobin 14.6 13.0 - 17.0 g/dL   HCT 30.842.8 65.739.0 - 84.652.0 %   MCV 91.6 78.0 - 100.0 fL   MCH 31.3 26.0 - 34.0 pg   MCHC 34.1 30.0 - 36.0 g/dL   RDW 96.212.8 95.211.5 - 84.115.5 %   Platelets 237 150 - 400 K/uL  I-stat troponin, ED  Result Value Ref Range   Troponin i, poc 0.00 0.00 - 0.08 ng/mL   Comment 3           Dg Chest 2 View  Result Date: 06/05/2016 CLINICAL DATA:  Left-sided chest pain and shortness of breath with exertion for 1 week. Initial encounter. EXAM: CHEST  2 VIEW COMPARISON:  None. FINDINGS: Lungs are clear. Heart size is normal. No pneumothorax or pleural effusion. No bony abnormality. IMPRESSION: Negative chest. Electronically Signed   By: Drusilla Kannerhomas  Dalessio M.D.   On: 06/05/2016 19:14     EKG  EKG Interpretation  Date/Time:  Wednesday June 05 2016 18:15:24 EDT Ventricular Rate:  89 PR Interval:  132 QRS Duration: 84 QT Interval:  324 QTC Calculation: 394 R Axis:   84 Text Interpretation:  Normal sinus rhythm Right atrial enlargement Borderline ECG Confirmed by Adriana SimasOOK  MD, BRIAN ([id number]) on 06/05/2016 9:22:45 PM       Radiology Dg Chest 2 View  Result Date: 06/05/2016 CLINICAL DATA:  Left-sided chest pain and shortness of breath with exertion for 1 week. Initial encounter. EXAM: CHEST  2 VIEW COMPARISON:  None. FINDINGS: Lungs are clear. Heart size is normal. No pneumothorax or pleural effusion. No bony abnormality. IMPRESSION: Negative chest. Electronically Signed   By: Drusilla Kannerhomas  Dalessio M.D.   On: 06/05/2016 19:14    Procedures Procedures (including critical care time)  Medications Ordered in ED Medications - No data to display   Initial Impression / Assessment and Plan / ED Course  I have reviewed the triage vital signs and the nursing notes.  Pertinent labs & imaging results that were available during my care of the patient  were reviewed by me and considered in my medical decision making (see chart for details).  Clinical Course    Chest pain evaluated with labs, EKG and CXR, all of which are negative for acute or concerning finding. Character of chest pain c/w anxiety or panic. This was discussed with the patient.  Sore throat does not appear strep like. Testing not performed. He can follow up with PCP [facility]   Final Clinical Impressions(s) / ED Diagnoses   Final diagnoses:  None  1. Pharyngitis 2. Chest pain, nonspecific  New Prescriptions New Prescriptions   No medications on file     Elpidio Anis, Cordelia Poche 06/05/16 2230    Donnetta Hutching, MD 06/10/16 1225

## 2016-06-15 ENCOUNTER — Encounter (HOSPITAL_COMMUNITY): Payer: Self-pay | Admitting: *Deleted

## 2016-06-15 ENCOUNTER — Emergency Department (HOSPITAL_COMMUNITY): Payer: Self-pay

## 2016-06-15 DIAGNOSIS — F17210 Nicotine dependence, cigarettes, uncomplicated: Secondary | ICD-10-CM | POA: Insufficient documentation

## 2016-06-15 DIAGNOSIS — K219 Gastro-esophageal reflux disease without esophagitis: Secondary | ICD-10-CM | POA: Insufficient documentation

## 2016-06-15 NOTE — ED Triage Notes (Signed)
The pt has had chest pain for a long time  He feels a burning in his chest  He has been seen here x 3 for the same.  He was referred to a go specialist but he has not had time to get checked out.  He is not having pain at present because he has most pain at night when he sleeps and eats.

## 2016-06-16 ENCOUNTER — Emergency Department (HOSPITAL_COMMUNITY)
Admission: EM | Admit: 2016-06-16 | Discharge: 2016-06-16 | Disposition: A | Discharge status: Home / Self Care | Payer: Self-pay | Attending: Emergency Medicine | Admitting: Emergency Medicine

## 2016-06-16 DIAGNOSIS — K219 Gastro-esophageal reflux disease without esophagitis: Secondary | ICD-10-CM

## 2016-06-16 MED ORDER — GI COCKTAIL ~~LOC~~
30.0000 mL | Freq: Once | ORAL | Status: AC
  Administered 2016-06-16: 30 mL via ORAL
  Filled 2016-06-16: qty 30

## 2016-06-16 MED ORDER — PANTOPRAZOLE SODIUM 40 MG PO TBEC: 40.0000 mg | DELAYED_RELEASE_TABLET | Freq: Every day | ORAL | Status: DC

## 2016-06-16 MED ORDER — RANITIDINE HCL 300 MG PO TABS: 300.0000 mg | ORAL_TABLET | Freq: Every day | ORAL | 2 refills | Status: AC

## 2016-06-16 MED ORDER — SUCRALFATE 1 GM/10ML PO SUSP: 1.0000 g | Freq: Three times a day (TID) | ORAL | 0 refills | Status: AC

## 2016-06-16 NOTE — ED Provider Notes (Signed)
[facility] Provider Note   CSN: [id number] Arrival date & time: 06/15/16  2312     History   Chief Complaint Chief Complaint  Patient presents with  . Chest Pain    HPI Aaron Phelps is a 20 y.o. male.  HPI   Patient with a PMH of no significant PMH comes to the ER for his third evaluation of CP. The pain has been going on for a long time. He describes it as burning. He was referred to a specialist but reports not having time to go. He also denies having pain at this moment. The pain is worse at night and when he sleeps eats. Denies having any SOB, mouth pain, N/V/D, fevers.  History reviewed. No pertinent past medical history.  There are no active problems to display for this patient.   History reviewed. No pertinent surgical history.     Home Medications    Prior to Admission medications   Medication Sig Start Date End Date Taking? Authorizing Provider  ranitidine (ZANTAC) 300 MG tablet Take 1 tablet (300 mg total) by mouth at bedtime. 06/16/16   Marlon Pel, PA-C    Family History No family history on file.  Social History Social History  Substance Use Topics  . Smoking status: Current Every Day Smoker    Types: Cigarettes  . Smokeless tobacco: Never Used  . Alcohol use Yes     Comment: occassion     Allergies   Review of patient's allergies indicates no known allergies.   Review of Systems Review of Systems Review of Systems All other systems negative except as documented in the HPI. All pertinent positives and negatives as reviewed in the HPI.   Physical Exam Updated Vital Signs BP 106/62 (BP Location: Right Arm)   Pulse 94   Temp 98 F (36.7 C) (Oral)   Resp 18   SpO2 100%   Physical Exam  Constitutional: He appears well-developed and well-nourished. No distress.  HENT:  Head: Normocephalic and atraumatic.  Right Ear: Tympanic membrane and ear canal normal.  Left Ear: Tympanic membrane and ear canal normal.  Nose: Nose  normal.  Mouth/Throat: Uvula is midline, oropharynx is clear and moist and mucous membranes are normal.  Eyes: Pupils are equal, round, and reactive to light.  Neck: Normal range of motion. Neck supple.  Cardiovascular: Normal rate and regular rhythm.   Pulmonary/Chest: Effort normal.  Abdominal: Soft. Bowel sounds are normal. There is tenderness in the epigastric area. There is no rigidity and no guarding.    No signs of abdominal distention  Musculoskeletal:  No LE swelling  Neurological: He is alert.  Acting at baseline  Skin: Skin is warm and dry. No rash noted.  Nursing note and vitals reviewed.    ED Treatments / Results  Labs (all labs ordered are listed, but only abnormal results are displayed) Labs Reviewed - No data to display  EKG  EKG Interpretation None       Radiology Dg Chest 2 View  Result Date: 06/15/2016 CLINICAL DATA:  Left-sided chest pain. EXAM: CHEST  2 VIEW COMPARISON:  June 05, 2016 FINDINGS: Mild nodularity in the right apex, unchanged. No other nodules or masses. No pneumothorax. The cardiomediastinal silhouette is normal. No other acute abnormalities.   IMPRESSION: Mild nodularity in the right apex, stable in the interval. T in hen a 20 year old patient, the findings are of doubtful significance. A CT scan could further evaluate. Electronically Signed   By: Gerome Sam III M.D  On: 06/15/2016 23:59    Procedures Procedures (including critical care time)  Medications Ordered in ED Medications  pantoprazole (PROTONIX) EC tablet 40 mg (not administered)     Initial Impression / Assessment and Plan / ED Course  I have reviewed the triage vital signs and the nursing notes.  Pertinent labs & imaging results that were available during my care of the patient were reviewed by me and considered in my medical decision making (see chart for details).  Clinical Course    Patient is to be discharged with recommendation to follow up with  PCP in regards to today's hospital visit. Chest pain is not likely of cardiac or pulmonary etiology d/t presentation, perc negative, VSS, no tracheal deviation, no JVD or new murmur, RRR, breath sounds equal bilaterally, EKG without acute abnormalities, negative troponin, and negative CXR. Pt has been advised start a PPI and return to the ED is CP becomes exertional, associated with diaphoresis or nausea, radiates to left jaw/arm, worsens or becomes concerning in any way. Pt appears reliable for follow up and is agreeable to discharge.    Final Clinical Impressions(s) / ED Diagnoses   Final diagnoses:  Gastroesophageal reflux disease without esophagitis    New Prescriptions New Prescriptions   RANITIDINE (ZANTAC) 300 MG TABLET    Take 1 tablet (300 mg total) by mouth at bedtime.     Marlon Peliffany Larone Kliethermes, PA-C 06/16/16 0107    Dione Boozeavid Glick, MD 06/16/16 [phone]

## 2016-06-17 ENCOUNTER — Encounter (HOSPITAL_COMMUNITY): Payer: Self-pay | Admitting: Emergency Medicine

## 2016-06-17 ENCOUNTER — Emergency Department (HOSPITAL_COMMUNITY)
Admission: EM | Admit: 2016-06-17 | Discharge: 2016-06-18 | Disposition: A | Discharge status: Home / Self Care | Payer: Self-pay | Attending: Emergency Medicine | Admitting: Emergency Medicine

## 2016-06-17 DIAGNOSIS — K2100 Gastro-esophageal reflux disease with esophagitis, without bleeding: Secondary | ICD-10-CM

## 2016-06-17 DIAGNOSIS — F17210 Nicotine dependence, cigarettes, uncomplicated: Secondary | ICD-10-CM | POA: Insufficient documentation

## 2016-06-17 MED ORDER — GI COCKTAIL ~~LOC~~
30.0000 mL | Freq: Once | ORAL | Status: AC
  Administered 2016-06-17: 30 mL via ORAL
  Filled 2016-06-17: qty 30

## 2016-06-17 NOTE — ED Notes (Signed)
[facility] about [facility], condition, and symptoms.  EKG done, GI Cocktail provided

## 2016-06-17 NOTE — ED Triage Notes (Signed)
Pt presents to ED for delivery of additional GI Cocktail.  Pt was seen here yesterday for CP, ans given GI Cockail and was sent home with a prescription for carafate.  Pt sts he is having he same pain, and was not able to fill his prescription, and is wanting a dose "of that liquid they gave me last time".

## 2016-06-18 NOTE — ED Provider Notes (Signed)
[facility] Provider Note   CSN: [id number] Arrival date & time: 06/17/16  2318     History   Chief Complaint Chief Complaint  Patient presents with  . Medication Refill    HPI Aaron Phelps is a 20 y.o. male who presents to the ED requesting medication like he has had on previous visits for his epigastric pain. Patient has had multiple visits to the ED for chest/epigastric pain. He has been referred to GI and given Rx for GERD. Patient reports that he did not get his medication filled after his visit last night so he is here to get more of the liquid medication. Patient has his Rx from last that has not been filled. Patient denies shortness of breath, n/v or other problems. He states he did not know that there was a 24 hour pharmacy.  Patient states he has not eaten today because he Works at [employer] and was afraid all the food was to greasy. He request a diet for GERD.   HPI  History reviewed. No pertinent past medical history.  There are no active problems to display for this patient.   History reviewed. No pertinent surgical history.     Home Medications    Prior to Admission medications   Medication Sig Start Date End Date Taking? Authorizing Provider  ranitidine (ZANTAC) 300 MG tablet Take 1 tablet (300 mg total) by mouth at bedtime. 06/16/16   Tiffany Neva SeatGreene, PA-C  sucralfate (CARAFATE) 1 GM/10ML suspension Take 10 mLs (1 g total) by mouth 4 (four) times daily -  with meals and at bedtime. 06/16/16   Marlon Peliffany Greene, PA-C    Family History History reviewed. No pertinent family history.  Social History Social History  Substance Use Topics  . Smoking status: Current Every Day Smoker    Types: Cigarettes  . Smokeless tobacco: Never Used  . Alcohol use Yes     Comment: occassion     Allergies   Review of patient's allergies indicates no known allergies.   Review of Systems Review of Systems Negative except as stated in HPI  Physical Exam Updated  Vital Signs BP 120/72 (BP Location: Right Arm)   Pulse 84   Temp 98.7 F (37.1 C) (Oral)   Resp 14   Ht 5\' 10"  (1.778 m)   Wt 49.9 kg   SpO2 100%   BMI 15.79 kg/m   Physical Exam  Constitutional: He is oriented to person, place, and time. He appears well-developed and well-nourished. No distress.  HENT:  Head: Normocephalic and atraumatic.  Eyes: EOM are normal.  Neck: Trachea normal. Neck supple. Normal carotid pulses and no JVD present. Carotid bruit is not present.  Cardiovascular: Normal rate and regular rhythm.   Pulmonary/Chest: Effort normal.  Abdominal: Soft. Bowel sounds are normal. He exhibits no distension. There is no tenderness.  Musculoskeletal: Normal range of motion.  Neurological: He is alert and oriented to person, place, and time. No cranial nerve deficit.  Skin: Skin is warm and dry.  Psychiatric: He has a normal mood and affect. His behavior is normal.  Nursing note and vitals reviewed.    ED Treatments / Results  Labs  Patient was given GI Cocktail in triage and reports no pain at this time.    Radiology No results found.  Procedures Procedures (including critical care time)  Medications Ordered in ED Medications  gi cocktail (Maalox,Lidocaine,Donnatal) (30 mLs Oral Given 06/17/16 2339)     Initial Impression / Assessment and Plan / ED  Course  I have reviewed the triage vital signs and the nursing notes. Clinical Course   Final Clinical Impressions(s) / ED Diagnoses  20 y.o. male with epigastric pain that has been off and on for several weeks stable for d/c with complete relief after GI Cocktail, no SOB, no n/v, NAD. He will go to the pharmacy to fill his medication when he leaves the ED.  Final diagnoses:  Reflux esophagitis    New Prescriptions Discharge Medication List as of 06/18/2016 12:27 AM       Janne Napoleon, NP 06/20/16 1353    Laurence Spates, MD 06/26/16 [phone]

## 2016-08-15 ENCOUNTER — Emergency Department (HOSPITAL_COMMUNITY)
Admission: EM | Admit: 2016-08-15 | Discharge: 2016-08-16 | Disposition: A | Discharge status: Home / Self Care | Payer: Self-pay | Attending: Emergency Medicine | Admitting: Emergency Medicine

## 2016-08-15 ENCOUNTER — Encounter (HOSPITAL_COMMUNITY): Payer: Self-pay

## 2016-08-15 DIAGNOSIS — K6289 Other specified diseases of anus and rectum: Secondary | ICD-10-CM | POA: Insufficient documentation

## 2016-08-15 DIAGNOSIS — F17210 Nicotine dependence, cigarettes, uncomplicated: Secondary | ICD-10-CM | POA: Insufficient documentation

## 2016-08-15 LAB — BASIC METABOLIC PANEL WITH GFR
Anion gap: 10 (ref 5–15)
BUN: 9 mg/dL (ref 6–20)
CO2: 25 mmol/L (ref 22–32)
Calcium: 8.7 mg/dL — ABNORMAL LOW (ref 8.9–10.3)
Chloride: 100 mmol/L — ABNORMAL LOW (ref 101–111)
Creatinine, Ser: 0.7 mg/dL (ref 0.61–1.24)
GFR calc Af Amer: >60 mL/min
GFR calc non Af Amer: >60 mL/min
Glucose, Bld: 111 mg/dL — ABNORMAL HIGH (ref 65–99)
Potassium: 3.5 mmol/L (ref 3.5–5.1)
Sodium: 135 mmol/L (ref 135–145)

## 2016-08-15 LAB — CBC WITH DIFFERENTIAL/PLATELET
BASOS PCT: 0 %
Basophils Absolute: 0 10*3/uL (ref 0.0–0.1)
EOS ABS: 0 10*3/uL (ref 0.0–0.7)
Eosinophils Relative: 1 %
HEMATOCRIT: 34.9 % — AB (ref 39.0–52.0)
Hemoglobin: 11.8 g/dL — ABNORMAL LOW (ref 13.0–17.0)
LYMPHS ABS: 1.6 10*3/uL (ref 0.7–4.0)
Lymphocytes Relative: 24 %
MCH: 30.6 pg (ref 26.0–34.0)
MCHC: 33.8 g/dL (ref 30.0–36.0)
MCV: 90.4 fL (ref 78.0–100.0)
MONO ABS: 0.5 10*3/uL (ref 0.1–1.0)
MONOS PCT: 7 %
NEUTROS ABS: 4.8 10*3/uL (ref 1.7–7.7)
Neutrophils Relative %: 68 %
Platelets: 221 10*3/uL (ref 150–400)
RBC: 3.86 MIL/uL — ABNORMAL LOW (ref 4.22–5.81)
RDW: 13.4 % (ref 11.5–15.5)
WBC: 7 10*3/uL (ref 4.0–10.5)

## 2016-08-15 NOTE — ED Provider Notes (Signed)
[facility] Provider Note   CSN: [id number] Arrival date & time: 08/15/16  2149     History   Chief Complaint Chief Complaint  Patient presents with  . Rectal Pain    HPI Aaron Phelps is a 20 y.o. male.  HPI Patient presents with 2 day history of gradual onset, constant, worsening rectal pain.  Patient was seen 01/2016 and treated for anal fissure, but states this pain feels different.  He denies recent anal intercourse.  Last BM was 2-3 days ago and reports it was normal.  He has not had a BM since his pain started.  He states he has the urge to go, but cannot due to pain.  He denies rectal bleeding.  He denies fever, but had a temperature of 100 in triage.  No N/V or abdominal pain.  He denies urinary symptoms.  He has not tried anything for his symptoms.  It is worse with sitting, movement, walking, or attempting to have a BM.  History reviewed. No pertinent past medical history.  There are no active problems to display for this patient.   History reviewed. No pertinent surgical history.     Home Medications    Prior to Admission medications   Medication Sig Start Date End Date Taking? Authorizing Provider  ranitidine (ZANTAC) 300 MG tablet Take 1 tablet (300 mg total) by mouth at bedtime. 06/16/16   Tiffany Neva SeatGreene, PA-C  sucralfate (CARAFATE) 1 GM/10ML suspension Take 10 mLs (1 g total) by mouth 4 (four) times daily -  with meals and at bedtime. 06/16/16   Marlon Peliffany Greene, PA-C    Family History History reviewed. No pertinent family history.  Social History Social History  Substance Use Topics  . Smoking status: Current Every Day Smoker    Packs/day: 0.50    Types: Cigarettes  . Smokeless tobacco: Never Used  . Alcohol use Yes     Comment: occassion     Allergies   Patient has no known allergies.   Review of Systems Review of Systems   Physical Exam Updated Vital Signs BP 116/72   Pulse 94   Temp 100 F (37.8 C) (Oral)   Resp 14   Ht 5\' 10"   (1.778 m)   Wt 51.7 kg   SpO2 96%   BMI 16.36 kg/m   Physical Exam  Constitutional: He is oriented to person, place, and time. He appears well-developed and well-nourished.  Non-toxic appearance. He does not have a sickly appearance. He does not appear ill.  HENT:  Head: Normocephalic and atraumatic.  Mouth/Throat: Oropharynx is clear and moist.  Eyes: Conjunctivae are normal.  Neck: Normal range of motion. Neck supple.  Cardiovascular: Normal rate and regular rhythm.   Pulmonary/Chest: Effort normal and breath sounds normal. No accessory muscle usage or stridor. No respiratory distress. He has no wheezes. He has no rhonchi. He has no rales.  Abdominal: Soft. Bowel sounds are normal. He exhibits no distension. There is no tenderness. There is no rebound and no guarding.  Genitourinary:  Genitourinary Comments: Exquisite pain with DRE.  No gross blood on DRE.  No palpable stool.  No external hemorrhoids.  No anal fissure.  Exquisite tenderness buttocks without erythema or warmth.  Musculoskeletal: Normal range of motion.  Lymphadenopathy:    He has no cervical adenopathy.  Neurological: He is alert and oriented to person, place, and time.  Speech clear without dysarthria.  Skin: Skin is warm and dry.  Psychiatric: He has a normal mood and affect.  His behavior is normal.   All other systems negative unless otherwise stated in HPI   ED Treatments / Results  Labs (all labs ordered are listed, but only abnormal results are displayed) Labs Reviewed  CBC WITH DIFFERENTIAL/PLATELET - Abnormal; Notable for the following:       Result Value   RBC 3.86 (*)    Hemoglobin 11.8 (*)    HCT 34.9 (*)    All other components within normal limits  BASIC METABOLIC PANEL - Abnormal; Notable for the following:    Chloride 100 (*)    Glucose, Bld 111 (*)    Calcium 8.7 (*)    All other components within normal limits    EKG  EKG Interpretation None       Radiology No results  found.  Procedures Procedures (including critical care time)  Medications Ordered in ED Medications  iopamidol (ISOVUE-300) 61 % injection (not administered)     Initial Impression / Assessment and Plan / ED Course  I have reviewed the triage vital signs and the nursing notes.  Pertinent labs & imaging results that were available during my care of the patient were reviewed by me and considered in my medical decision making (see chart for details).  Clinical Course    Patient presents with rectal pain x 2 days.  No bleeding. No drainage.  No abdominal pain.  Temp 100 in triage. Exquisite tenderness with external palpation and DRE.  No gross blood. No external hemorrhoid.  No obvious anal fissure.  Abdomen soft and non-tender.  Plan to obtain CT pelvis to evaluate for abscess given exquisite internal/external tenderness in temp 100.  Patient care hand off to oncoming PA, Elpidio AnisShari Upstill, who will follow up on CT scan and appropriate disposition.  Final Clinical Impressions(s) / ED Diagnoses   Final diagnoses:  Rectal pain    New Prescriptions New Prescriptions   No medications on file     Cheri FowlerKayla Nikol Lemar, PA-C 08/16/16 0100    Gilda Creasehristopher J Pollina, MD 08/16/16 [phone]

## 2016-08-15 NOTE — ED Triage Notes (Signed)
Pt from home with complaint of rectal pain and states "it feels swollen" x 2 days. Pt denies chills or fever but has oral temp of 100.0 here. Pt states "I thought I washed to hard when I showered" Denies bleeding. Pt denies hx of hemorrhoids.

## 2016-08-16 ENCOUNTER — Emergency Department (HOSPITAL_COMMUNITY): Payer: Self-pay

## 2016-08-16 ENCOUNTER — Encounter (HOSPITAL_COMMUNITY): Payer: Self-pay | Admitting: Radiology

## 2016-08-16 MED ORDER — IOPAMIDOL (ISOVUE-300) INJECTION 61%
INTRAVENOUS | Status: AC
  Administered 2016-08-16: 75 mL
  Filled 2016-08-16: qty 100

## 2016-08-16 MED ORDER — IOPAMIDOL (ISOVUE-300) INJECTION 61%
INTRAVENOUS | Status: AC
  Filled 2016-08-16: qty 30

## 2016-08-16 MED ORDER — HYDROCORTISONE 2.5 % RE CREA: TOPICAL_CREAM | RECTAL | 0 refills | Status: AC

## 2016-08-16 MED ORDER — HYDROCORTISONE ACETATE 25 MG RE SUPP
25.0000 mg | Freq: Once | RECTAL | Status: AC
  Administered 2016-08-16: 25 mg via RECTAL
  Filled 2016-08-16: qty 1

## 2016-08-16 NOTE — Discharge Instructions (Signed)
FOLLOW UP WITH [facility] FOR FURTHER EVALUATION OF RECURRENT RECTAL PAIN. RECOMMEND ANUSOL ALONG WITH A STOOL SOFTENER AND TUCK'S MEDICATED PADS, FOUND OVER-THE-COUNTER. RETURN HERE WITH ANY SEVERE PAIN OR HIGH FEVER.

## 2016-08-16 NOTE — ED Notes (Signed)
Pt transported to CT ?

## 2016-08-16 NOTE — ED Provider Notes (Signed)
C/o rectal pain, h/o fissure No bleeding - tender on rectal exam CT pending to r/o perirectal abscess  Plan: re-eval, d/ch home with GI referral if CT negative.  CT negative for abscess or fistula. The patient sleeping on re-evaluation. Will provide Rx for anusol suppository, recommend stool softener and Tuck's medicated pads. Will also provide GI follow up if symptoms persist.    Elpidio AnisShari Agustus Mane, PA-C 08/16/16 0425    Gilda Creasehristopher J Pollina, MD 08/16/16 [phone]

## 2016-09-09 DIAGNOSIS — B20 Human immunodeficiency virus [HIV] disease: Secondary | ICD-10-CM

## 2016-09-09 DIAGNOSIS — Z21 Asymptomatic human immunodeficiency virus [HIV] infection status: Secondary | ICD-10-CM

## 2016-09-09 HISTORY — DX: Asymptomatic human immunodeficiency virus (hiv) infection status: Z21

## 2016-09-09 HISTORY — DX: Human immunodeficiency virus (HIV) disease: B20

## 2016-09-21 ENCOUNTER — Emergency Department (HOSPITAL_COMMUNITY)
Admission: EM | Admit: 2016-09-21 | Discharge: 2016-09-21 | Disposition: A | Discharge status: Home / Self Care | Payer: Medicaid Other | Attending: Emergency Medicine | Admitting: Emergency Medicine

## 2016-09-21 ENCOUNTER — Encounter (HOSPITAL_COMMUNITY): Payer: Self-pay | Admitting: Emergency Medicine

## 2016-09-21 DIAGNOSIS — Z79899 Other long term (current) drug therapy: Secondary | ICD-10-CM | POA: Insufficient documentation

## 2016-09-21 DIAGNOSIS — R6889 Other general symptoms and signs: Secondary | ICD-10-CM

## 2016-09-21 DIAGNOSIS — K645 Perianal venous thrombosis: Secondary | ICD-10-CM

## 2016-09-21 DIAGNOSIS — K644 Residual hemorrhoidal skin tags: Secondary | ICD-10-CM | POA: Insufficient documentation

## 2016-09-21 DIAGNOSIS — F17210 Nicotine dependence, cigarettes, uncomplicated: Secondary | ICD-10-CM | POA: Insufficient documentation

## 2016-09-21 LAB — CBC WITH DIFFERENTIAL/PLATELET
Basophils Absolute: 0 10*3/uL (ref 0.0–0.1)
Basophils Relative: 0 %
Eosinophils Absolute: 0.2 10*3/uL (ref 0.0–0.7)
Eosinophils Relative: 2 %
HEMATOCRIT: 37 % — AB (ref 39.0–52.0)
Hemoglobin: 12.9 g/dL — ABNORMAL LOW (ref 13.0–17.0)
LYMPHS PCT: 21 %
Lymphs Abs: 1.6 10*3/uL (ref 0.7–4.0)
MCH: 30.7 pg (ref 26.0–34.0)
MCHC: 34.9 g/dL (ref 30.0–36.0)
MCV: 88.1 fL (ref 78.0–100.0)
Monocytes Absolute: 0.6 10*3/uL (ref 0.1–1.0)
Monocytes Relative: 8 %
NEUTROS ABS: 5 10*3/uL (ref 1.7–7.7)
Neutrophils Relative %: 69 %
Platelets: 267 10*3/uL (ref 150–400)
RBC: 4.2 MIL/uL — ABNORMAL LOW (ref 4.22–5.81)
RDW: 12.9 % (ref 11.5–15.5)
WBC: 7.2 10*3/uL (ref 4.0–10.5)

## 2016-09-21 LAB — COMPREHENSIVE METABOLIC PANEL
ALT: 32 U/L (ref 17–63)
ANION GAP: 9 (ref 5–15)
AST: 37 U/L (ref 15–41)
Albumin: 3.5 g/dL (ref 3.5–5.0)
Alkaline Phosphatase: 48 U/L (ref 38–126)
BILIRUBIN TOTAL: 0.4 mg/dL (ref 0.3–1.2)
BUN: 9 mg/dL (ref 6–20)
CHLORIDE: 104 mmol/L (ref 101–111)
CO2: 24 mmol/L (ref 22–32)
Calcium: 8.9 mg/dL (ref 8.9–10.3)
Creatinine, Ser: 0.71 mg/dL (ref 0.61–1.24)
GFR calc Af Amer: >60 mL/min (ref >60)
GFR calc non Af Amer: >60 mL/min (ref >60)
GLUCOSE: 83 mg/dL (ref 65–99)
POTASSIUM: 3.9 mmol/L (ref 3.5–5.1)
SODIUM: 137 mmol/L (ref 135–145)
TOTAL PROTEIN: 7.6 g/dL (ref 6.5–8.1)

## 2016-09-21 LAB — URINALYSIS, ROUTINE W REFLEX MICROSCOPIC
Bilirubin Urine: NEGATIVE
GLUCOSE, UA: NEGATIVE mg/dL
Hgb urine dipstick: NEGATIVE
Ketones, ur: NEGATIVE mg/dL
Leukocytes, UA: NEGATIVE
Nitrite: NEGATIVE
PH: 7 (ref 5.0–8.0)
Protein, ur: NEGATIVE mg/dL
Specific Gravity, Urine: 1.024 (ref 1.005–1.030)

## 2016-09-21 LAB — INFLUENZA PANEL BY PCR (TYPE A & B)
Influenza A By PCR: NEGATIVE
Influenza B By PCR: NEGATIVE

## 2016-09-21 LAB — I-STAT CG4 LACTIC ACID, ED
LACTIC ACID, VENOUS: 0.88 mmol/L (ref 0.5–1.9)
Lactic Acid, Venous: 0.66 mmol/L (ref 0.5–1.9)

## 2016-09-21 MED ORDER — ONDANSETRON 4 MG PO TBDP
4.0000 mg | ORAL_TABLET | Freq: Once | ORAL | Status: AC
  Administered 2016-09-21: 4 mg via ORAL
  Filled 2016-09-21: qty 1

## 2016-09-21 MED ORDER — OXYCODONE-ACETAMINOPHEN 5-325 MG PO TABS: 1.0000 | ORAL_TABLET | ORAL | 0 refills | Status: AC | PRN

## 2016-09-21 MED ORDER — SODIUM CHLORIDE 0.9 % IV BOLUS (SEPSIS)
1000.0000 mL | Freq: Once | INTRAVENOUS | Status: AC
  Administered 2016-09-21: 1000 mL via INTRAVENOUS

## 2016-09-21 MED ORDER — DOCUSATE SODIUM 250 MG PO CAPS: 250.0000 mg | ORAL_CAPSULE | Freq: Every day | ORAL | 0 refills | Status: AC

## 2016-09-21 MED ORDER — OXYCODONE-ACETAMINOPHEN 5-325 MG PO TABS
2.0000 | ORAL_TABLET | Freq: Once | ORAL | Status: AC
  Administered 2016-09-21: 2 via ORAL
  Filled 2016-09-21: qty 2

## 2016-09-21 MED ORDER — OXYCODONE-ACETAMINOPHEN 5-325 MG PO TABS: 1.0000 | ORAL_TABLET | ORAL | 0 refills | Status: DC | PRN

## 2016-09-21 MED ORDER — ACETAMINOPHEN 325 MG PO TABS
650.0000 mg | ORAL_TABLET | Freq: Once | ORAL | Status: AC
  Administered 2016-09-21: 650 mg via ORAL
  Filled 2016-09-21: qty 2

## 2016-09-21 MED ORDER — LIDOCAINE 5 % EX OINT
TOPICAL_OINTMENT | Freq: Once | CUTANEOUS | Status: AC
  Administered 2016-09-21: 20:00:00 via TOPICAL
  Filled 2016-09-21: qty 35.44

## 2016-09-21 MED ORDER — LIDOCAINE 5 % EX OINT: 1.0000 "application " | TOPICAL_OINTMENT | Freq: Four times a day (QID) | CUTANEOUS | 0 refills | Status: AC | PRN

## 2016-09-21 NOTE — Discharge Instructions (Signed)
.  You appear to have an upper respiratory infection (URI). An upper respiratory tract infection, or cold, is a viral infection of the air passages leading to the lungs. It is contagious and can be spread to others, especially during the first 3 or 4 days. It cannot be cured by antibiotics or other medicines. RETURN IMMEDIATELY IF you develop shortness of breath, confusion or altered mental status, a new rash, become dizzy, faint, or poorly responsive, or are unable to be cared for at home. Stop using A&D ointment. You may use regular vaseline. You can also use cool cloths. To wipe. Take motrin for your fever and chills.

## 2016-09-21 NOTE — ED Notes (Signed)
Pt stable, understands discharge instructions, and reasons for return.   

## 2016-09-21 NOTE — ED Triage Notes (Signed)
The patient said he was seen here for the same thing about a month ago and was given a prescription.  He said he never got the medication because he could not afford it.  He says he cannot take the pain anymore.  He says he hurts when he has a bowel movement it hurts and when he wipes the tissue has some blood on it.  He rates his pain 10/10.

## 2016-09-21 NOTE — ED Provider Notes (Signed)
[facility] Provider Note   CSN: [id number] Arrival date & time: 09/21/16  1337     History   Chief Complaint Chief Complaint  Patient presents with  . Hemorrhoids    The patient said he was seen here for the same thing about a month ago and was given a prescription.  He said he never got the medication because he could not afford it.  He says he cannot take the pain anymore.  He says he hurts when he has a bowel movement it hurts and when he wipes the tissue has some blood on it.  He rates his pain 10/10.    HPI Aaron Phelps is a 21 y.o. male who presents with c/o rectal pain. The patient has had these sxs for about 1 month. He was seen prior 08/16/2016. He had a ct scan his pelvis which showed no abscess or fistula and was diagnosed with hemorrhoids.. The patient c/o constant pain around his anus. He states he has severe pain every time he sits on his bottom or walks however if he lies on his side he has minimal pain. He has noticed a lot of skin breakdown around his anus. He's been using AMD ointment. His last ER visit he was prescribed Anusol suppositories which he states he was unable to afford. The patient denies any possibility of STD in his rectum. His last episode of receptive anal intercourse was 3-4 months prior to the onset of these symptoms. At this time it was unprotected. She has also had recent symptoms of cough, chills, body aches. He states these had multiple family members with colds in the household. He states that he has frequent soaking night sweats over the past month as well. He is not noticed fever or weight loss.  HPI  History reviewed. No pertinent past medical history.  There are no active problems to display for this patient.   History reviewed. No pertinent surgical history.     Home Medications    Prior to Admission medications   Medication Sig Start Date End Date Taking? Authorizing Provider  hydrocortisone (ANUSOL-HC) 2.5 % rectal cream Apply  rectally 2 times daily 08/16/16   Elpidio Anis, PA-C  ranitidine (ZANTAC) 300 MG tablet Take 1 tablet (300 mg total) by mouth at bedtime. 06/16/16   Tiffany Neva Seat, PA-C  sucralfate (CARAFATE) 1 GM/10ML suspension Take 10 mLs (1 g total) by mouth 4 (four) times daily -  with meals and at bedtime. 06/16/16   Marlon Pel, PA-C    Family History History reviewed. No pertinent family history.  Social History Social History  Substance Use Topics  . Smoking status: Current Every Day Smoker    Packs/day: 0.50    Types: Cigarettes  . Smokeless tobacco: Never Used  . Alcohol use Yes     Comment: occassion     Allergies   Patient has no known allergies.   Review of Systems Review of Systems  Ten systems reviewed and are negative for acute change, except as noted in the HPI.   Physical Exam Updated Vital Signs BP 133/78 (BP Location: Right Arm)   Pulse (!) 124   Temp 100.8 F (38.2 C) (Oral)   Resp 16   Ht 5\' 10"  (1.778 m)   Wt 51.7 kg   SpO2 99%   BMI 16.36 kg/m   Physical Exam  Constitutional: He is oriented to person, place, and time. He appears well-developed.  Extremely thin and protein malnourished  HENT:  Head: Normocephalic  and atraumatic.  Discoid and scaled skin patches on bilateral cheeks  Eyes: EOM are normal. Pupils are equal, round, and reactive to light.  Neck: Normal range of motion. Neck supple.  Cardiovascular: Exam reveals no friction rub.   No murmur heard. tachycardic  Pulmonary/Chest: Effort normal and breath sounds normal.  Abdominal: Soft. Bowel sounds are normal. He exhibits no distension. There is no tenderness.  Genitourinary:  Genitourinary Comments: Digital Rectal Exam reveals sphincter with good tone.  Patient would not tolerate internal exam. 2 small thrombosed hemorrhoids. Multiple areas of skin breakdown and ulceration   Musculoskeletal: Normal range of motion.  Neurological: He is alert and oriented to person, place, and time.    Nursing note and vitals reviewed.    ED Treatments / Results  Labs (all labs ordered are listed, but only abnormal results are displayed) Labs Reviewed  CBC WITH DIFFERENTIAL/PLATELET - Abnormal; Notable for the following:       Result Value   RBC 4.20 (*)    Hemoglobin 12.9 (*)    HCT 37.0 (*)    All other components within normal limits  CULTURE, BLOOD (ROUTINE X 2)  CULTURE, BLOOD (ROUTINE X 2)  URINE CULTURE  COMPREHENSIVE METABOLIC PANEL  URINALYSIS, ROUTINE W REFLEX MICROSCOPIC  I-STAT CG4 LACTIC ACID, ED  I-STAT CG4 LACTIC ACID, ED    EKG  EKG Interpretation None       Radiology No results found.  Procedures Procedures (including critical care time)  Medications Ordered in ED Medications - No data to display   Initial Impression / Assessment and Plan / ED Course  I have reviewed the triage vital signs and the nursing notes.  Pertinent labs & imaging results that were available during my care of the patient were reviewed by me and considered in my medical decision making (see chart for details).  Clinical Course as of Sep 22 38  Sat Sep 21, 2016  2206 [facility]  [AH]    Clinical Course User Index [AH] Arthor CaptainAbigail Iretha Kirley, PA-C    Patient's fever and tachycardia resolved with Tylenol. Likely secondary to URI. Patient does have thrombosed hemorrhoids which are causing his pain, along with some skin breakdown. I supplied the patient with lidocaine ointment. He may also use Vaseline for barrier protection. Advised patient to stop using A&D ointment. [place] controlled substance reporting system was reviewed and patient is without any medications. I will spell. Patient with some pain medication, Colace. [facility] is [facility] his [facility].  Final Clinical Impressions(s) / ED Diagnoses   Final diagnoses:  External hemorrhoid, thrombosed  Flu-like  symptoms    New Prescriptions Discharge Medication List as of 09/21/2016 10:19 PM    START taking these medications   Details  docusate sodium (COLACE) 250 MG capsule Take 1 capsule (250 mg total) by mouth daily., Starting Sat 09/21/2016, Print    lidocaine (XYLOCAINE) 5 % ointment Apply 1 application topically 4 (four) times daily as needed., Starting Sat 09/21/2016, Print         AdamsAbigail Avaline Stillson, PA-C 09/22/16 [phone]    Margarita Grizzleanielle Ray, MD 09/22/16 2342

## 2016-09-22 ENCOUNTER — Telehealth: Payer: Self-pay | Admitting: Infectious Disease

## 2016-09-22 LAB — URINE CULTURE: CULTURE: NO GROWTH

## 2016-09-22 LAB — HIV ANTIBODY (ROUTINE TESTING W REFLEX)

## 2016-09-22 LAB — HIV 1/2 AB DIFFERENTIATION
HIV 1 Ab: POSITIVE — AB
HIV 2 AB: NEGATIVE

## 2016-09-22 LAB — RPR: RPR Ser Ql: NONREACTIVE

## 2016-09-22 NOTE — Telephone Encounter (Signed)
Pt with + HIV test. 21 year old. No insurance. Needs plug into our clinic for care. Can we send DIS to him?

## 2016-09-26 LAB — CULTURE, BLOOD (ROUTINE X 2)
Culture: NO GROWTH
Culture: NO GROWTH

## 2016-10-13 ENCOUNTER — Emergency Department (HOSPITAL_COMMUNITY)
Admission: EM | Admit: 2016-10-13 | Discharge: 2016-10-13 | Disposition: A | Discharge status: Home / Self Care | Payer: Self-pay | Attending: Emergency Medicine | Admitting: Emergency Medicine

## 2016-10-13 ENCOUNTER — Encounter (HOSPITAL_COMMUNITY): Payer: Self-pay | Admitting: *Deleted

## 2016-10-13 ENCOUNTER — Emergency Department (HOSPITAL_COMMUNITY): Payer: Self-pay

## 2016-10-13 DIAGNOSIS — F17210 Nicotine dependence, cigarettes, uncomplicated: Secondary | ICD-10-CM | POA: Insufficient documentation

## 2016-10-13 DIAGNOSIS — R69 Illness, unspecified: Secondary | ICD-10-CM

## 2016-10-13 DIAGNOSIS — J111 Influenza due to unidentified influenza virus with other respiratory manifestations: Secondary | ICD-10-CM | POA: Insufficient documentation

## 2016-10-13 LAB — CBC WITH DIFFERENTIAL/PLATELET
BASOS PCT: 0 %
Basophils Absolute: 0 10*3/uL (ref 0.0–0.1)
EOS ABS: 0.2 10*3/uL (ref 0.0–0.7)
Eosinophils Relative: 3 %
HCT: 35 % — ABNORMAL LOW (ref 39.0–52.0)
Hemoglobin: 11.8 g/dL — ABNORMAL LOW (ref 13.0–17.0)
Lymphocytes Relative: 32 %
Lymphs Abs: 1.8 10*3/uL (ref 0.7–4.0)
MCH: 29.6 pg (ref 26.0–34.0)
MCHC: 33.7 g/dL (ref 30.0–36.0)
MCV: 87.9 fL (ref 78.0–100.0)
MONO ABS: 0.4 10*3/uL (ref 0.1–1.0)
MONOS PCT: 8 %
NEUTROS PCT: 57 %
Neutro Abs: 3.2 10*3/uL (ref 1.7–7.7)
Platelets: 251 10*3/uL (ref 150–400)
RBC: 3.98 MIL/uL — ABNORMAL LOW (ref 4.22–5.81)
RDW: 13.3 % (ref 11.5–15.5)
WBC: 5.6 10*3/uL (ref 4.0–10.5)

## 2016-10-13 LAB — COMPREHENSIVE METABOLIC PANEL
ALK PHOS: 42 U/L (ref 38–126)
ALT: 17 U/L (ref 17–63)
AST: 30 U/L (ref 15–41)
Albumin: 3.7 g/dL (ref 3.5–5.0)
Anion gap: 11 (ref 5–15)
BILIRUBIN TOTAL: 0.6 mg/dL (ref 0.3–1.2)
BUN: 6 mg/dL (ref 6–20)
CALCIUM: 8.9 mg/dL (ref 8.9–10.3)
CO2: 25 mmol/L (ref 22–32)
Chloride: 101 mmol/L (ref 101–111)
Creatinine, Ser: 0.67 mg/dL (ref 0.61–1.24)
GFR calc non Af Amer: >60 mL/min (ref >60)
Glucose, Bld: 97 mg/dL (ref 65–99)
Potassium: 4.1 mmol/L (ref 3.5–5.1)
SODIUM: 137 mmol/L (ref 135–145)
Total Protein: 7.5 g/dL (ref 6.5–8.1)

## 2016-10-13 MED ORDER — OSELTAMIVIR PHOSPHATE 75 MG PO CAPS: 75.0000 mg | ORAL_CAPSULE | Freq: Two times a day (BID) | ORAL | 0 refills | Status: AC

## 2016-10-13 MED ORDER — ONDANSETRON HCL 4 MG PO TABS: 4.0000 mg | ORAL_TABLET | Freq: Four times a day (QID) | ORAL | 0 refills | Status: AC

## 2016-10-13 MED ORDER — SODIUM CHLORIDE 0.9 % IV BOLUS (SEPSIS)
500.0000 mL | Freq: Once | INTRAVENOUS | Status: AC
  Administered 2016-10-13: 500 mL via INTRAVENOUS

## 2016-10-13 MED ORDER — OSELTAMIVIR PHOSPHATE 75 MG PO CAPS
75.0000 mg | ORAL_CAPSULE | Freq: Once | ORAL | Status: AC
  Administered 2016-10-13: 75 mg via ORAL
  Filled 2016-10-13: qty 1

## 2016-10-13 NOTE — ED Provider Notes (Signed)
[facility]-EMER[facility]ENCY DEPT Provider Note   CSN: [id number] Arrival date & time: 10/13/16  1405     History   Chief Complaint Chief Complaint  Patient presents with  . Fever  . Fatigue    HPI Aaron Phelps is a 21 y.o. male.  The history is provided by the patient. No language interpreter was used.  Fever      Aaron Phelps is a 21 y.o. male who presents to the Emergency Department complaining of fatigue.   For the last 3-4 days he has experienced intermittent subjective fevers, cough with chest congestion, generalized weakness/malaise.  Today he had nausea and decreased appetite.  No vomiting.  No diarrhea, constipation.  He was recently diagnosed with HIV and has not been evaluated by ID yet - he is still waiting on an appointment.    History reviewed. No pertinent past medical history.  There are no active problems to display for this patient.   History reviewed. No pertinent surgical history.     Home Medications    Prior to Admission medications   Medication Sig Start Date End Date Taking? Authorizing Provider  docusate sodium (COLACE) 250 MG capsule Take 1 capsule (250 mg total) by mouth daily. 09/21/16   Arthor Captain, PA-C  hydrocortisone (ANUSOL-HC) 2.5 % rectal cream Apply rectally 2 times daily Patient not taking: Reported on 09/21/2016 08/16/16   Elpidio Anis, PA-C  ibuprofen (ADVIL,MOTRIN) 200 MG tablet Take 200 mg by mouth every 6 (six) hours as needed.    Historical Provider, MD  lidocaine (XYLOCAINE) 5 % ointment Apply 1 application topically 4 (four) times daily as needed. 09/21/16   Abigail Harris, PA-C  ondansetron (ZOFRAN) 4 MG tablet Take 1 tablet (4 mg total) by mouth every 6 (six) hours. 10/13/16   Tilden Fossa, MD  oseltamivir (TAMIFLU) 75 MG capsule Take 1 capsule (75 mg total) by mouth every 12 (twelve) hours. 10/13/16   Tilden Fossa, MD  oxyCODONE-acetaminophen (PERCOCET) 5-325 MG tablet Take 1-2 tablets by mouth every 4 (four) hours as needed. 09/21/16    Arthor Captain, PA-C  ranitidine (ZANTAC) 300 MG tablet Take 1 tablet (300 mg total) by mouth at bedtime. Patient not taking: Reported on 09/21/2016 06/16/16   Marlon Pel, PA-C  sucralfate (CARAFATE) 1 GM/10ML suspension Take 10 mLs (1 g total) by mouth 4 (four) times daily -  with meals and at bedtime. Patient not taking: Reported on 09/21/2016 06/16/16   Marlon Pel, PA-C    Family History History reviewed. No pertinent family history.  Social History Social History  Substance Use Topics  . Smoking status: Current Every Day Smoker    Packs/day: 0.50    Types: Cigarettes  . Smokeless tobacco: Never Used  . Alcohol use Yes     Comment: occassion     Allergies   Patient has no known allergies.   Review of Systems Review of Systems  Constitutional: Positive for fever.  Gastrointestinal:       Hemorrhoids  All other systems reviewed and are negative.    Physical Exam Updated Vital Signs BP (!) 136/101   Pulse 79   Temp 98.4 F (36.9 C) (Oral)   Resp 18   SpO2 100%   Physical Exam  Constitutional: He is oriented to person, place, and time. He appears well-developed.  Thin  HENT:  Head: Normocephalic and atraumatic.  Cardiovascular: Normal rate and regular rhythm.   No murmur heard. Pulmonary/Chest: Effort normal and breath sounds normal. No respiratory distress.  Abdominal: Soft.  There is no tenderness. There is no rebound and no guarding.  Musculoskeletal: He exhibits no edema or tenderness.  Neurological: He is alert and oriented to person, place, and time.  Skin: Skin is warm and dry.  Psychiatric: He has a normal mood and affect. His behavior is normal.  Nursing note and vitals reviewed.    ED Treatments / Results  Labs (all labs ordered are listed, but only abnormal results are displayed) Labs Reviewed  CBC WITH DIFFERENTIAL/PLATELET - Abnormal; Notable for the following:       Result Value   RBC 3.98 (*)    Hemoglobin 11.8 (*)    HCT 35.0 (*)     All other components within normal limits  COMPREHENSIVE METABOLIC PANEL    EKG  EKG Interpretation None       Radiology Dg Chest 2 View  Result Date: 10/13/2016 CLINICAL DATA:  Productive cough, fever and chills for 1 month. EXAM: CHEST  2 VIEW COMPARISON:  06/15/2016 FINDINGS: The cardiac silhouette, mediastinal and hilar contours are normal and stable. The lungs are clear. No pleural effusion. The bony thorax is normal. IMPRESSION: Normal chest x-ray. Electronically Signed   By: Rudie MeyerP.  Gallerani M.D.   On: 10/13/2016 17:59    Procedures Procedures (including critical care time)  Medications Ordered in ED Medications  sodium chloride 0.9 % bolus 500 mL (0 mLs Intravenous Stopped 10/13/16 1818)  oseltamivir (TAMIFLU) capsule 75 mg (75 mg Oral Given 10/13/16 1952)     Initial Impression / Assessment and Plan / ED Course  I have reviewed the triage vital signs and the nursing notes.  Pertinent labs & imaging results that were available during my care of the patient were reviewed by me and considered in my medical decision making (see chart for details).    Patient with recent diagnosis of HIV but no follow-up presents to the emergency department with several days of fever, cough, malaise. He has no respiratory distress on examination with good air movement bilaterally. No evidence of acute pneumonia or serious bacterial infection at this time. Counseled pt on home care for likely viral illness, possible flu. Treating with Tamiflu given his symptoms, Zofran for nausea. Discussed the importance of outpatient follow-up and return precautions. Recommended Tylenol or ibuprofen as needed for fever/body aches.  Pt lives with his mother Cleopatra CedarShavon Spragley and the number listed in the chart is hers.  He is ok with phone calls to arrange medical appointments but does not want medical details expressed with her at this time.   Final Clinical Impressions(s) / ED Diagnoses   Final diagnoses:    Influenza-like illness    New Prescriptions Discharge Medication List as of 10/13/2016  7:40 PM    START taking these medications   Details  ondansetron (ZOFRAN) 4 MG tablet Take 1 tablet (4 mg total) by mouth every 6 (six) hours., Starting Sun 10/13/2016, Print    oseltamivir (TAMIFLU) 75 MG capsule Take 1 capsule (75 mg total) by mouth every 12 (twelve) hours., Starting Sun 10/13/2016, Print         Tilden FossaElizabeth Luis Nickles, MD 10/14/16 0100

## 2016-10-13 NOTE — ED Notes (Signed)
On way to XR 

## 2016-10-13 NOTE — ED Triage Notes (Signed)
Pt reports having fever, fatigue, lack of appetite x 3 days. Reports productive cough with yellow mucus, headache, bodyaches. Denies vomiting or diarrhea but has abd discomfort due to not eating.

## 2016-10-14 DIAGNOSIS — B20 Human immunodeficiency virus [HIV] disease: Secondary | ICD-10-CM | POA: Insufficient documentation

## 2016-10-17 ENCOUNTER — Encounter: Payer: Self-pay | Admitting: Infectious Diseases

## 2016-10-17 ENCOUNTER — Ambulatory Visit (INDEPENDENT_AMBULATORY_CARE_PROVIDER_SITE_OTHER): Payer: Self-pay | Admitting: Infectious Diseases

## 2016-10-17 ENCOUNTER — Other Ambulatory Visit: Payer: Self-pay | Admitting: Pharmacist Clinician (PhC)/ Clinical Pharmacy Specialist

## 2016-10-17 VITALS — BP 138/85 | HR 97 | Temp 98.8°F | Ht 67.0 in | Wt 103.0 lb

## 2016-10-17 DIAGNOSIS — Z79899 Other long term (current) drug therapy: Secondary | ICD-10-CM

## 2016-10-17 DIAGNOSIS — B370 Candidal stomatitis: Secondary | ICD-10-CM

## 2016-10-17 DIAGNOSIS — B20 Human immunodeficiency virus [HIV] disease: Secondary | ICD-10-CM

## 2016-10-17 DIAGNOSIS — Z23 Encounter for immunization: Secondary | ICD-10-CM

## 2016-10-17 DIAGNOSIS — K649 Unspecified hemorrhoids: Secondary | ICD-10-CM

## 2016-10-17 LAB — LIPID PANEL
CHOL/HDL RATIO: 4 ratio (ref <5.0)
Cholesterol: 119 mg/dL (ref <200)
HDL: 30 mg/dL — ABNORMAL LOW (ref >40)
LDL CALC: 51 mg/dL (ref <100)
Triglycerides: 189 mg/dL — ABNORMAL HIGH (ref <150)
VLDL: 38 mg/dL — AB (ref <30)

## 2016-10-17 MED ORDER — ELVITEG-COBIC-EMTRICIT-TENOFAF 150-150-200-10 MG PO TABS: 1.0000 | ORAL_TABLET | Freq: Every day | ORAL | 5 refills | Status: DC

## 2016-10-17 MED ORDER — ELVITEG-COBIC-EMTRICIT-TENOFAF 150-150-200-10 MG PO TABS: 1.0000 | ORAL_TABLET | Freq: Every day | ORAL | 3 refills | Status: DC

## 2016-10-17 MED ORDER — FLUCONAZOLE 100 MG PO TABS: 100.0000 mg | ORAL_TABLET | Freq: Every day | ORAL | 0 refills | Status: DC

## 2016-10-17 NOTE — Progress Notes (Signed)
   Subjective:    Patient ID: Aaron Phelps, male    DOB: 01/14/1996, 21 y.o.   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 Was tested when he went to hospital.  Has been feeling weak, having occas chills. Feels hot all the time. Taking prn ibuprofen.   Infected via MSM.   Has had hemmeroids for last 2 months. Occas bleeding.   No results found for: HIV1RNAQUANT, HIV1RNAVL, CD4TABS  The past medical history, family history and social history were reviewed/updated in EPIC   Review of Systems  Constitutional: Positive for fatigue, fever and unexpected weight change. Negative for chills.  Eyes: Negative for visual disturbance.  Respiratory: Positive for shortness of breath. Negative for cough.   Gastrointestinal: Positive for diarrhea. Negative for constipation.  Genitourinary: Negative for difficulty urinating and genital sores.  Neurological: Negative for headaches.  Psychiatric/Behavioral: Negative for dysphoric mood. The patient is nervous/anxious.    Wt down 5# SOB worse at night.  Night sweats. Drenching.     Objective:   Physical Exam  Constitutional: He appears well-developed. He appears cachectic.  HENT:  Mouth/Throat: Oropharyngeal exudate present.  Eyes: EOM are normal. Pupils are equal, round, and reactive to light.  Neck: Normal range of motion. Neck supple.  Cardiovascular: Normal rate, regular rhythm and normal heart sounds.   Pulmonary/Chest: Effort normal and breath sounds normal.  Abdominal: Soft. Bowel sounds are normal. There is no tenderness. There is no rebound.  Musculoskeletal: He exhibits no edema.  Lymphadenopathy:    He has no cervical adenopathy.      Assessment & Plan:

## 2016-10-17 NOTE — Assessment & Plan Note (Signed)
Will start him on diflucan for 7 days.

## 2016-10-17 NOTE — Assessment & Plan Note (Addendum)
Will get him started on art- genvoya via [facility].  Given condoms Flu shot and pnvx Hep vax as needed by serologies, at f/u.  HPV at f/u.  Explained CD4 and HIV RNA to pt.  rtc  2-3 weeks.

## 2016-10-17 NOTE — Assessment & Plan Note (Signed)
Will f/u with surgery

## 2016-10-18 LAB — HEPATITIS B SURFACE ANTIBODY,QUALITATIVE: HEP B S AB: NEGATIVE

## 2016-10-18 LAB — HEPATITIS B SURFACE ANTIGEN: HEP B S AG: NEGATIVE

## 2016-10-18 LAB — T-HELPER CELL (CD4) - (RCID CLINIC ONLY)
CD4 T CELL ABS: 38 /uL — AB (ref 400–2700)
CD4 T CELL HELPER: 3 % — AB (ref 33–55)

## 2016-10-18 LAB — HEPATITIS C ANTIBODY: HCV Ab: NEGATIVE

## 2016-10-18 LAB — HEPATITIS A ANTIBODY, TOTAL: HEP A TOTAL AB: NONREACTIVE

## 2016-10-21 ENCOUNTER — Encounter: Payer: Self-pay | Admitting: Infectious Diseases

## 2016-10-25 LAB — HLA B*5701

## 2016-10-26 LAB — HIV-1 RNA,QN PCR W/REFLEX GENOTYPE
HIV-1 RNA, QN PCR: 4080000 Copies/mL — ABNORMAL HIGH
HIV-1 RNA, QN PCR: 6.61 {Log_copies}/mL — AB

## 2016-10-26 LAB — HIV-1 INTEGRASE GENOTYPE: DATE VIRAL LOAD COLLECTED: 2082018

## 2016-10-30 LAB — HIV-1 GENOTYPR PLUS

## 2016-11-11 ENCOUNTER — Telehealth: Payer: Self-pay | Admitting: Pharmacist Clinician (PhC)/ Clinical Pharmacy Specialist

## 2016-11-11 ENCOUNTER — Encounter: Payer: Self-pay | Admitting: Infectious Diseases

## 2016-11-11 ENCOUNTER — Ambulatory Visit (INDEPENDENT_AMBULATORY_CARE_PROVIDER_SITE_OTHER): Payer: Medicaid Other | Admitting: Infectious Diseases

## 2016-11-11 DIAGNOSIS — R059 Cough, unspecified: Secondary | ICD-10-CM | POA: Insufficient documentation

## 2016-11-11 DIAGNOSIS — K649 Unspecified hemorrhoids: Secondary | ICD-10-CM

## 2016-11-11 DIAGNOSIS — B20 Human immunodeficiency virus [HIV] disease: Secondary | ICD-10-CM

## 2016-11-11 DIAGNOSIS — R05 Cough: Secondary | ICD-10-CM | POA: Insufficient documentation

## 2016-11-11 DIAGNOSIS — B370 Candidal stomatitis: Secondary | ICD-10-CM

## 2016-11-11 MED ORDER — SULFAMETHOXAZOLE-TRIMETHOPRIM 400-80 MG PO TABS: 2.0000 | ORAL_TABLET | Freq: Three times a day (TID) | ORAL | 0 refills | Status: AC

## 2016-11-11 MED ORDER — FLUCONAZOLE 100 MG PO TABS: 100.0000 mg | ORAL_TABLET | Freq: Every day | ORAL | 0 refills | Status: AC

## 2016-11-11 MED ORDER — SULFAMETHOXAZOLE-TRIMETHOPRIM 400-80 MG PO TABS: 2.0000 | ORAL_TABLET | Freq: Three times a day (TID) | ORAL | 0 refills | Status: DC

## 2016-11-11 MED ORDER — FLUCONAZOLE 100 MG PO TABS: 100.0000 mg | ORAL_TABLET | Freq: Every day | ORAL | 0 refills | Status: DC

## 2016-11-11 MED FILL — SULFAMETHOXAZOLE/TMP SS TAB: 400-80 | 21 days supply | Qty: 126 | Fill #0

## 2016-11-11 MED FILL — FLUCONAZOLE 100 MG TABLET: 100 | 10 days supply | Qty: 10 | Fill #0

## 2016-11-11 NOTE — Assessment & Plan Note (Signed)
He has f/u on 3-12.

## 2016-11-11 NOTE — Telephone Encounter (Signed)
[facility] to give him the supply of Septra for PCP and fluconazole for candidiasis. He is going to pick it up tomorrow. Told him to start [facility] today with a meal. Scheduled him to come back in 2 wks to f/u with pharmacy for a bmet since he is on high dose septra.

## 2016-11-11 NOTE — Assessment & Plan Note (Signed)
Will get him diflucan

## 2016-11-11 NOTE — Progress Notes (Signed)
   Subjective:    Patient ID: Aaron Phelps, male    DOB: October 16, 1995, 21 y.o.   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 Was tested in hospital.  He was seen in ID on 10-17-16 and was placed on genvoya via [facility]. He did not get his medications.  He gets the rx today.  He is still struggling with hemmeroids. He does not have the co-pay at this time. He has appt on 3-12 [facility].  Did not get fluconazole either.   CD4 T Cell Abs (/uL)  Date Value  10/17/2016 38 (L)    Review of Systems  Constitutional: Positive for appetite change and unexpected weight change.  HENT: Positive for sore throat.   Respiratory: Positive for shortness of breath.   Gastrointestinal: Negative for constipation and diarrhea.  Genitourinary: Negative for difficulty urinating.   SOB with laying flat.  Persistent cough.     Objective:   Physical Exam  Constitutional: He appears well-developed and well-nourished.  HENT:  Mouth/Throat: Oropharyngeal exudate present.  Eyes: EOM are normal. Pupils are equal, round, and reactive to light.  Cardiovascular: Normal rate and normal heart sounds.   Pulmonary/Chest: Effort normal and breath sounds normal.  Abdominal: Soft. Bowel sounds are normal. There is no tenderness. There is no rebound.  Musculoskeletal: He exhibits no edema.          Assessment & Plan:

## 2016-11-11 NOTE — Telephone Encounter (Signed)
His supply was mailed here of Aaron Phelps was mailed her but he never picked it up. Aaron Phelps will hand it to him today. I put in a refill for a second month also.

## 2016-11-11 NOTE — Assessment & Plan Note (Addendum)
He asks when he was infected- not able to tell clearly.  Will give mening today.  He will start genvoya today.  Spoke with pharm/ADAP rtc in 1 month

## 2016-11-11 NOTE — Addendum Note (Signed)
Addended by: Nicholes CalamityPHAM, Shacarra Choe Q on: 11/11/2016 02:13 PM   Modules accepted: Orders

## 2016-11-11 NOTE — Assessment & Plan Note (Signed)
Will give him course of bactrim He does not appear toxic.

## 2016-11-12 ENCOUNTER — Telehealth: Payer: Self-pay | Admitting: Pharmacist Clinician (PhC)/ Clinical Pharmacy Specialist

## 2016-11-12 NOTE — Telephone Encounter (Signed)
He has meds right now. Will let him on f/u with pharmacy that his ADAP is approved.

## 2016-11-26 ENCOUNTER — Ambulatory Visit (INDEPENDENT_AMBULATORY_CARE_PROVIDER_SITE_OTHER): Payer: Self-pay | Admitting: Pharmacist Clinician (PhC)/ Clinical Pharmacy Specialist

## 2016-11-26 DIAGNOSIS — B20 Human immunodeficiency virus [HIV] disease: Secondary | ICD-10-CM

## 2016-11-26 LAB — BASIC METABOLIC PANEL
BUN: 7 mg/dL (ref 7–25)
CHLORIDE: 102 mmol/L (ref 98–110)
CO2: 28 mmol/L (ref 20–31)
CREATININE: 0.81 mg/dL (ref 0.60–1.35)
Calcium: 10 mg/dL (ref 8.6–10.3)
GLUCOSE: 67 mg/dL (ref 65–99)
Potassium: 4.4 mmol/L (ref 3.5–5.3)
Sodium: 140 mmol/L (ref 135–146)

## 2016-11-26 MED ORDER — BICTEGRAVIR-EMTRICITAB-TENOFOV 50-200-25 MG PO TABS: 1.0000 | ORAL_TABLET | Freq: Every day | ORAL | 3 refills | Status: DC

## 2016-11-26 NOTE — Patient Instructions (Signed)
Stop Genvoya Pick up Biktarvy today before you leave. Tell [facility] to mail starting next [facility] [place] clinic [place] to [place] care

## 2016-11-26 NOTE — Progress Notes (Signed)
HPI: Aaron Phelps is a 21 y.o. male who is here for a follow up with pharmacy for his HIV.   Allergies: No Known Allergies  Vitals:    Past Medical History: Past Medical History:  Diagnosis Date  . HIV (human immunodeficiency virus infection) (HCC) 09/2016    Social History: Social History   Social History  . Marital status: Single    Spouse name: N/A  . Number of children: N/A  . Years of education: N/A   Social History Main Topics  . Smoking status: Current Every Day Smoker    Packs/day: 0.25    Types: Cigarettes  . Smokeless tobacco: Never Used  . Alcohol use No     Comment: occassion  . Drug use: Yes    Types: Marijuana     Comment: occasional  . Sexual activity: Not Currently    Partners: Male   Other Topics Concern  . Not on file   Social History Narrative  . No narrative on file    Previous Regimen: None  Current Regimen: Genvoya  Labs: CD4 T Cell Abs (/uL)  Date Value  10/17/2016 38 (L)   Hep B S Ab (no units)  Date Value  10/17/2016 NEG   Hepatitis B Surface Ag (no units)  Date Value  10/17/2016 NEGATIVE   HCV Ab (no units)  Date Value  10/17/2016 NEGATIVE    CrCl: CrCl cannot be calculated (Patient's most recent lab result is older than the maximum 21 days allowed.).  Lipids:    Component Value Date/Time   CHOL 119 10/17/2016 1605   TRIG 189 (H) 10/17/2016 1605   HDL 30 (L) 10/17/2016 1605   CHOLHDL 4.0 10/17/2016 1605   VLDL 38 (H) 10/17/2016 1605   LDLCALC 51 10/17/2016 1605    Assessment:  Aaron Phelps saw me 2 wks ago during a visit with Dr. Ninetta LightsHatcher for his recently HIV dx. We started him on Genvoya for his HIV. [facility]. He is now approved for HMAP through the state. He had some early onset diarrhea with Genvoya. However, he doesn't eat that regularly so we will change it to [facility] now. He is also on high dose Septra for his suspected PCP pneumonia and fluconazole for  is oral candidiasis. He is has about 2 more fluconazole before completing the course. We are going to check his bmet today to check on his scr.   He told me that today will likely be has last day in [place] because he doesn't have place to stay. He has been sleeping at random people's house here. Therefore, [place] stay at [place] sister. [facility] [place] be[place] to see what he needs to do once he gets down there. They stated that he needs to come in to do an intake and they will request records from here. He is going to call them tomorrow to schedule the intake. Printed out the clinic address/phone number for him. He is going [facility] month.   Recommendations:  Change Genvoya to Biktarvy 1 PO qday [facility] [address] [facility] Bmet today Establish care with clinic [place]  Minh Pham, PharmD, BCPS, AAHIVP, CPP Clinical Infectious Disease Pharmacist [facility] 11/26/2016, 10:40 AM

## 2016-12-09 ENCOUNTER — Encounter: Payer: Self-pay | Admitting: Licensed Clinical Social Worker

## 2016-12-11 ENCOUNTER — Ambulatory Visit: Payer: Self-pay | Admitting: Infectious Diseases

## 2017-01-27 IMAGING — CT CT PELVIS W/ CM
2 of 3 series · 14 of 46 positions shown, 16 images · IV contrast (Iodine)
Comparison: None.

CLINICAL DATA: Rectal pain, concern for abscess or fistula.

EXAM:
CT PELVIS WITH CONTRAST
TECHNIQUE: Multidetector CT imaging of the pelvis was performed using the
standard protocol following the bolus administration of intravenous
contrast.
CONTRAST:  75mL A3B8ET-MLL IOPAMIDOL (A3B8ET-MLL) INJECTION 61%

[Series 201: routine, idose (2) · axial · 0.62mm/px · z∈[-352,-28]mm · 11 of 75 slices shown, 13 images]
[im 5/75  soft-tissue]
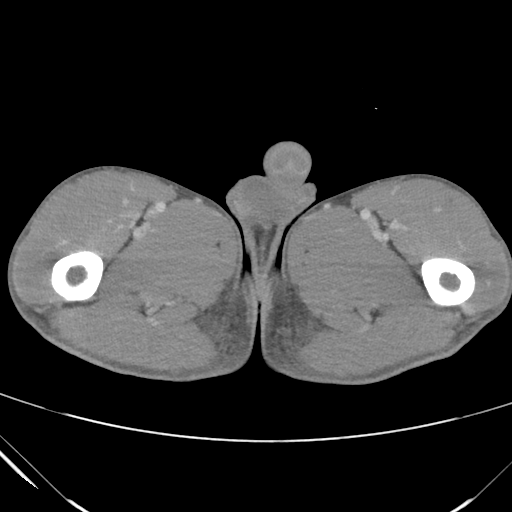
[im 5/75  bone]
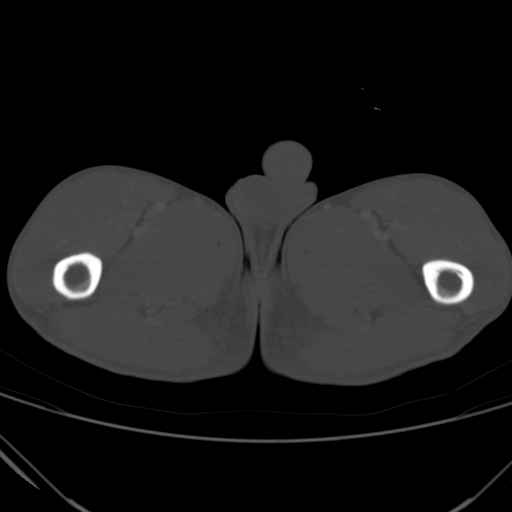
[im 12/75  soft-tissue]
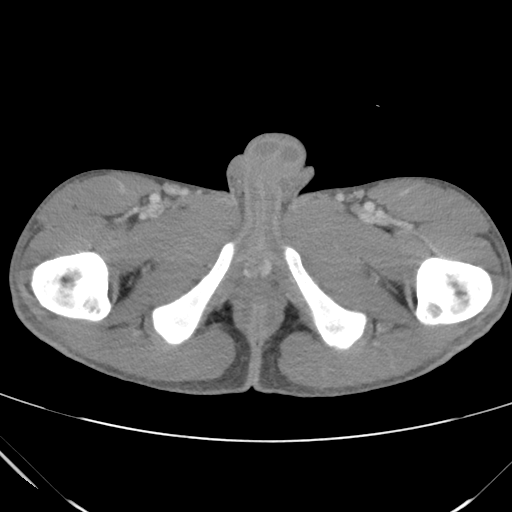
[im 17/75  soft-tissue]
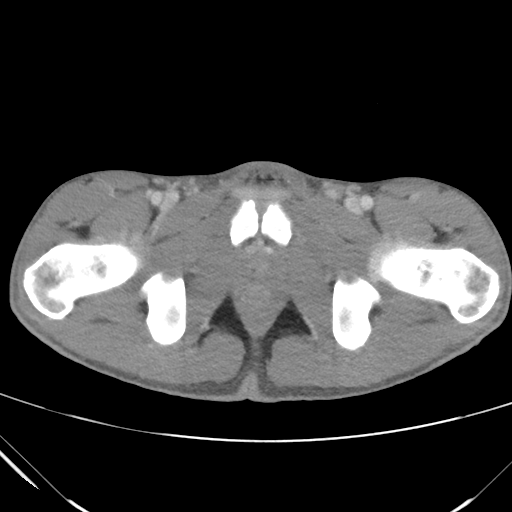
[im 24/75  soft-tissue]
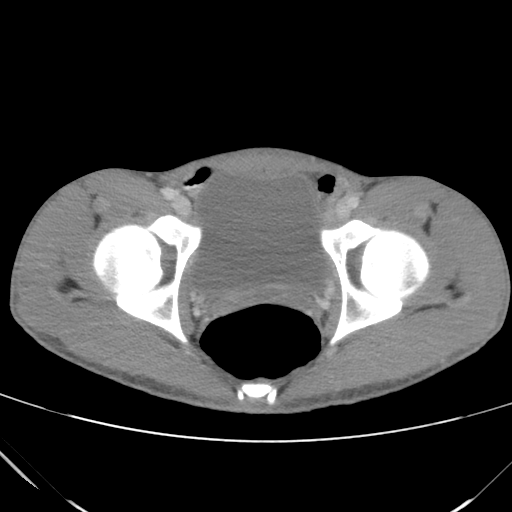
[im 32/75  soft-tissue]
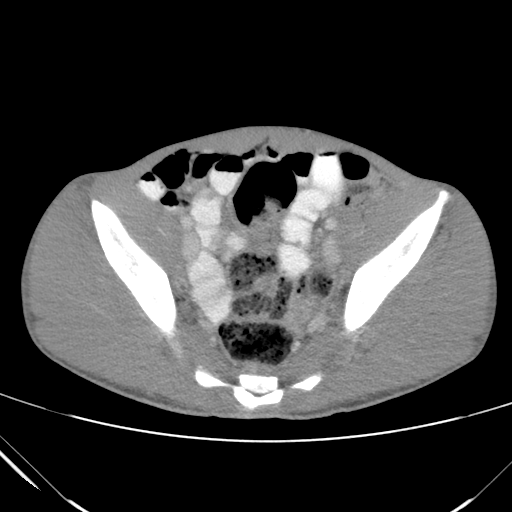
[im 39/75  soft-tissue]
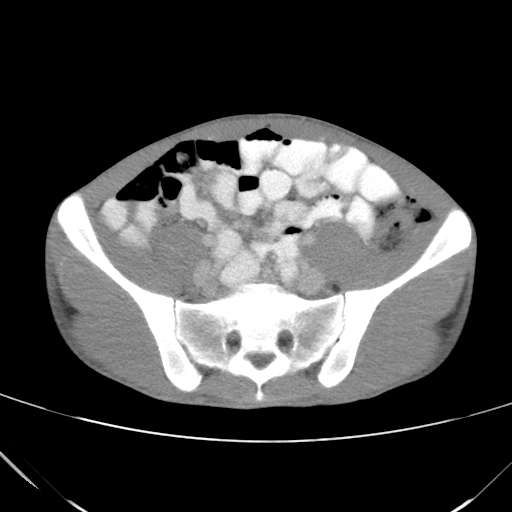
[im 43/75  soft-tissue]
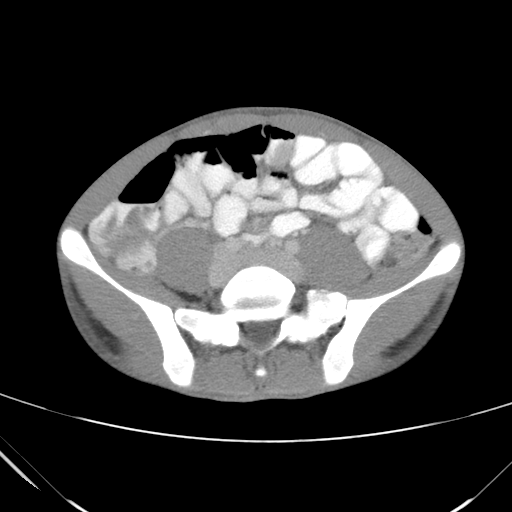
[im 51/75  soft-tissue]
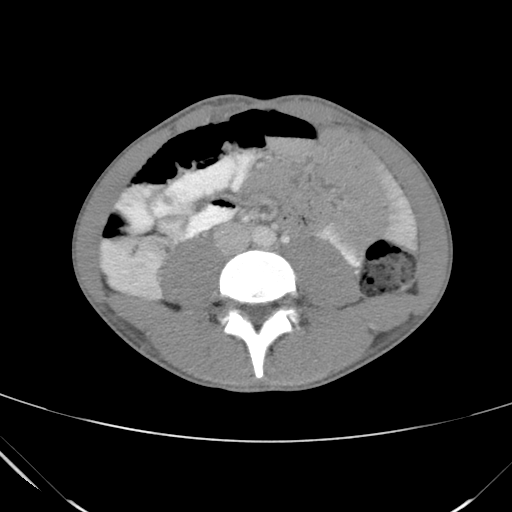
[im 58/75  soft-tissue]
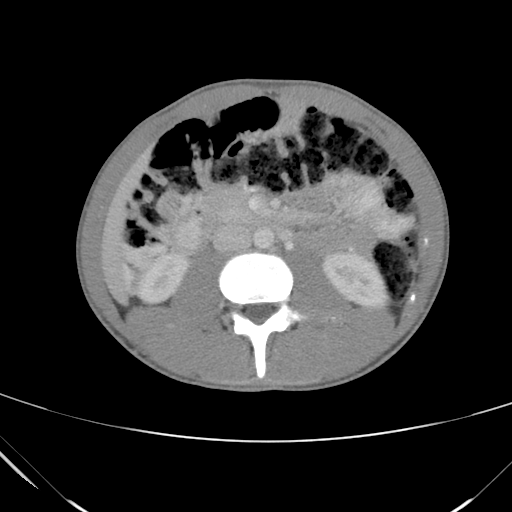
[im 58/75  bone]
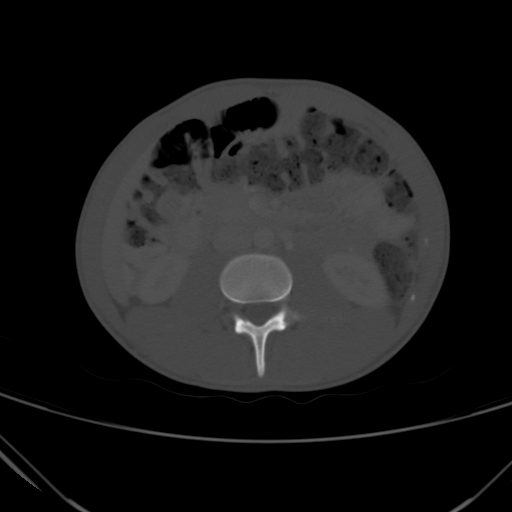
[im 63/75  soft-tissue]
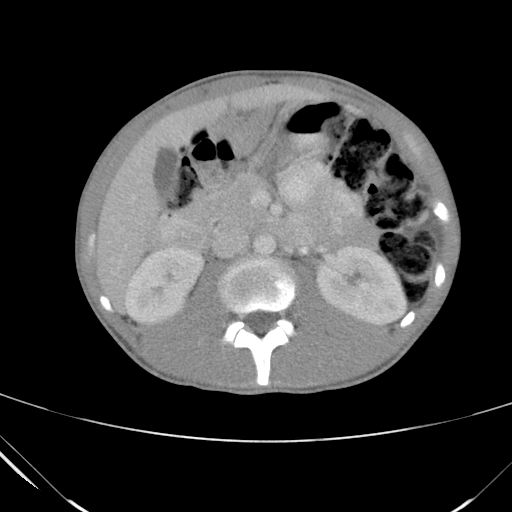
[im 70/75  soft-tissue]
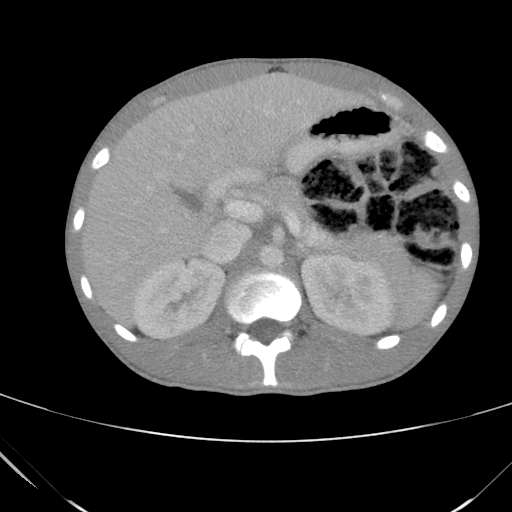

[Series 203: coronals, idose (2) · coronal · 0.45mm/px · 3 of 92 slices shown]
[im 31/92  soft-tissue]
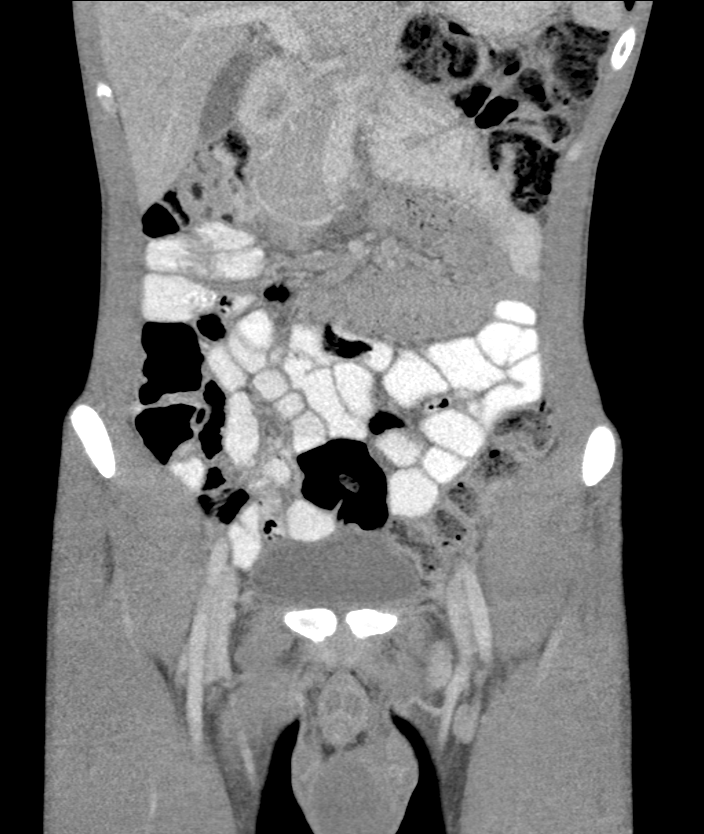
[im 41/92  soft-tissue]
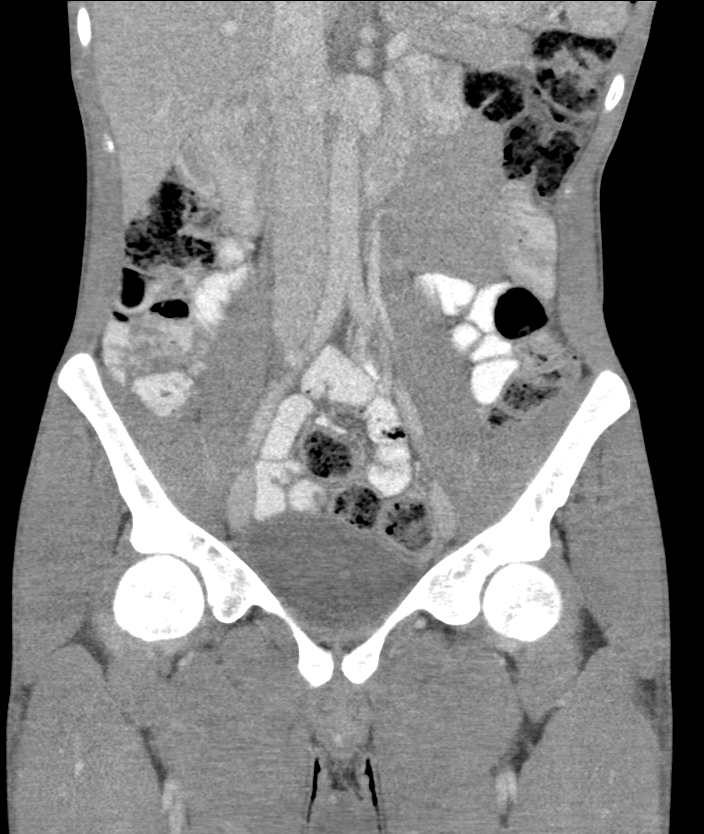
[im 51/92  soft-tissue]
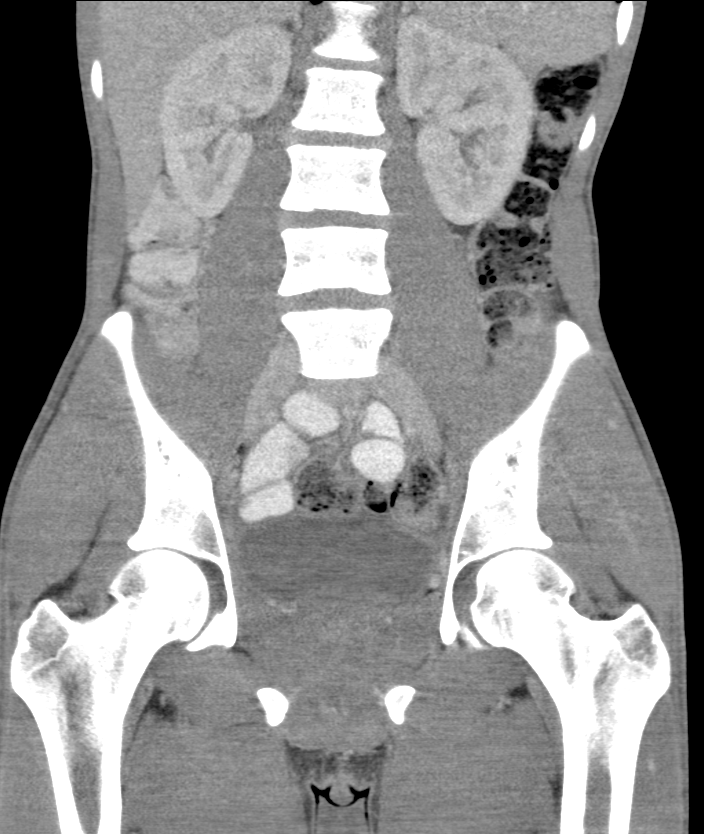

[14 of 46 positions shown; findings below may reference images not displayed]

FINDINGS: Urinary Tract: Bladder is physiologically distended without wall
thickening. Distal ureters are decompressed.

Bowel: No rectal wall thickening or perirectal fluid collection. No
evidence of perirectal abscess, inflammation, or fistula. Moderate
stool burden throughout the entire colon. No small bowel
obstruction. Appendix is normal.

Vascular/Lymphatic: No vascular abnormality. Prominent bilateral
inguinal nodes.

Reproductive:  Prostate gland is unremarkable.

Other:  No free fluid.

Musculoskeletal: There are no acute or suspicious osseous
abnormalities.
IMPRESSION: No perirectal abscess or evidence of fistula. Moderate stool burden.

## 2017-03-26 IMAGING — DX DG CHEST 2V
2 series · 2 of 2 positions shown · non-contrast
Comparison: 06/15/2016

CLINICAL DATA: Productive cough, fever and chills for 1 month.

EXAM:
CHEST  2 VIEW

[w chest pa]
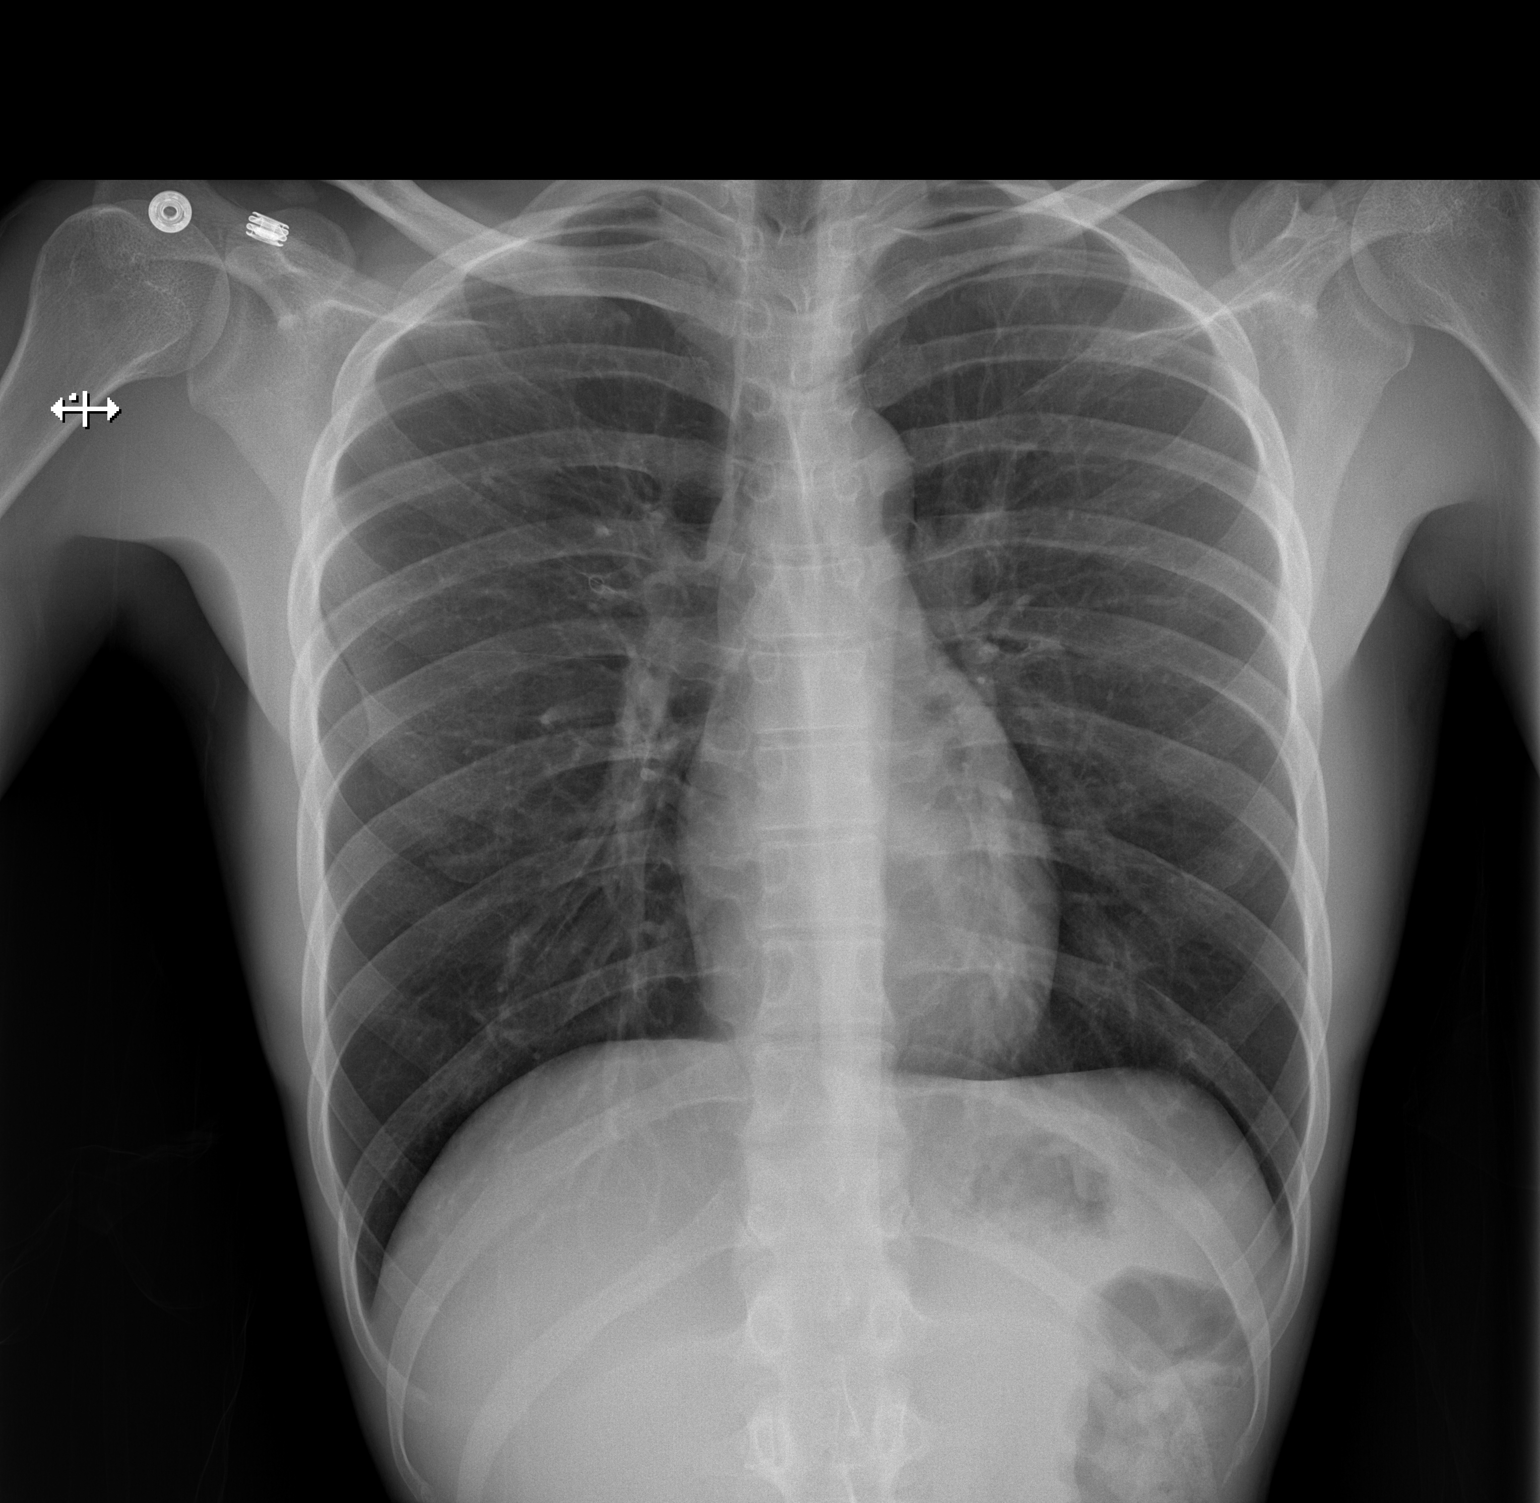

[w chest lat]
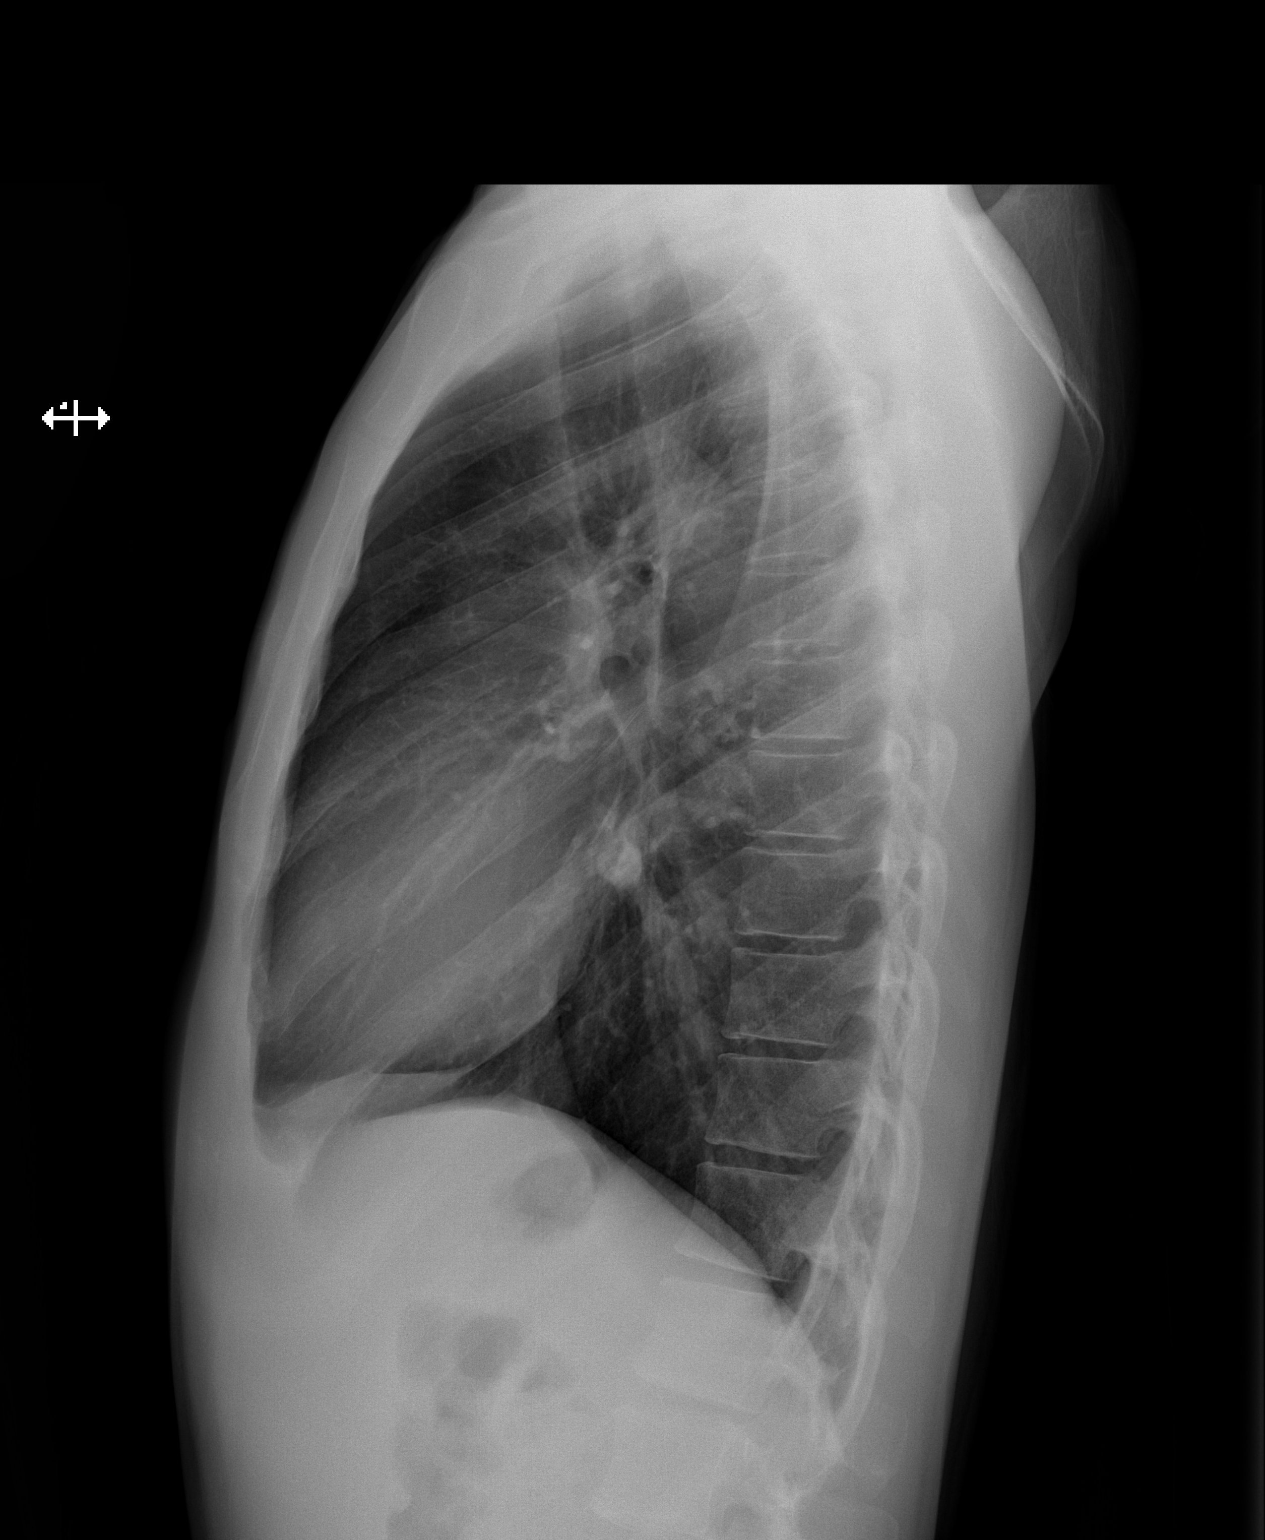

[2 of 2 positions shown; findings below may reference images not displayed]

FINDINGS: The cardiac silhouette, mediastinal and hilar contours are normal
and stable. The lungs are clear. No pleural effusion. The bony
thorax is normal.
IMPRESSION: Normal chest x-ray.

## 2017-03-29 ENCOUNTER — Other Ambulatory Visit: Payer: Self-pay | Admitting: Infectious Diseases

## 2017-04-26 ENCOUNTER — Other Ambulatory Visit: Payer: Self-pay | Admitting: Infectious Diseases

## 2017-08-16 ENCOUNTER — Other Ambulatory Visit: Payer: Self-pay | Admitting: Infectious Diseases

## 2017-08-19 ENCOUNTER — Telehealth: Payer: Self-pay | Admitting: *Deleted

## 2017-08-19 NOTE — Telephone Encounter (Signed)
Received refill request from patient. [place] He has an appointment 12/3 with Dr Champ [place]uzard [place]  This is a rw/adap clinic.  RN gave patient 30 day supply, he will get future refills from his new physician. Andree CossHowell, Dwight Burdo M, RN

## 2017-09-20 ENCOUNTER — Other Ambulatory Visit: Payer: Self-pay | Admitting: Infectious Diseases

## 2017-09-25 ENCOUNTER — Other Ambulatory Visit: Payer: Self-pay | Admitting: Infectious Diseases
